# Patient Record
Sex: Female | Born: 1975 | Race: White | Hispanic: No | Marital: Married | Smoking: Former smoker
Health system: Southern US, Community
[De-identification: ages and names within clinical notes are randomized; demographics above are authoritative.]

## PROBLEM LIST (undated history)

## (undated) DIAGNOSIS — I10 Essential (primary) hypertension: Secondary | ICD-10-CM

## (undated) DIAGNOSIS — D689 Coagulation defect, unspecified: Secondary | ICD-10-CM

## (undated) DIAGNOSIS — F32A Depression, unspecified: Secondary | ICD-10-CM

## (undated) DIAGNOSIS — R112 Nausea with vomiting, unspecified: Secondary | ICD-10-CM

## (undated) HISTORY — PX: ABLATION: SHX5711

## (undated) HISTORY — DX: Essential (primary) hypertension: I10

## (undated) HISTORY — DX: Depression, unspecified: F32.A

## (undated) HISTORY — PX: RIGHT OOPHORECTOMY: SHX2359

## (undated) HISTORY — DX: Coagulation defect, unspecified: D68.9

---

## 2019-06-07 DIAGNOSIS — I471 Supraventricular tachycardia: Secondary | ICD-10-CM | POA: Insufficient documentation

## 2019-06-07 DIAGNOSIS — D219 Benign neoplasm of connective and other soft tissue, unspecified: Secondary | ICD-10-CM | POA: Insufficient documentation

## 2021-03-17 ENCOUNTER — Ambulatory Visit: Payer: Self-pay | Admitting: Medical-Surgical

## 2021-03-23 ENCOUNTER — Other Ambulatory Visit: Payer: Self-pay

## 2021-03-23 ENCOUNTER — Other Ambulatory Visit (HOSPITAL_BASED_OUTPATIENT_CLINIC_OR_DEPARTMENT_OTHER): Payer: Self-pay

## 2021-03-23 ENCOUNTER — Ambulatory Visit (INDEPENDENT_AMBULATORY_CARE_PROVIDER_SITE_OTHER): Payer: 59 | Admitting: Medical-Surgical

## 2021-03-23 ENCOUNTER — Encounter: Payer: Self-pay | Admitting: Medical-Surgical

## 2021-03-23 VITALS — BP 143/81 | HR 100 | Temp 98.6°F | Ht 63.25 in | Wt 199.9 lb

## 2021-03-23 DIAGNOSIS — Z7689 Persons encountering health services in other specified circumstances: Secondary | ICD-10-CM

## 2021-03-23 DIAGNOSIS — N809 Endometriosis, unspecified: Secondary | ICD-10-CM | POA: Insufficient documentation

## 2021-03-23 DIAGNOSIS — Z6835 Body mass index (BMI) 35.0-35.9, adult: Secondary | ICD-10-CM

## 2021-03-23 DIAGNOSIS — Z114 Encounter for screening for human immunodeficiency virus [HIV]: Secondary | ICD-10-CM | POA: Diagnosis not present

## 2021-03-23 DIAGNOSIS — R1032 Left lower quadrant pain: Secondary | ICD-10-CM | POA: Diagnosis not present

## 2021-03-23 DIAGNOSIS — R928 Other abnormal and inconclusive findings on diagnostic imaging of breast: Secondary | ICD-10-CM | POA: Insufficient documentation

## 2021-03-23 DIAGNOSIS — Z1329 Encounter for screening for other suspected endocrine disorder: Secondary | ICD-10-CM

## 2021-03-23 DIAGNOSIS — I10 Essential (primary) hypertension: Secondary | ICD-10-CM | POA: Diagnosis not present

## 2021-03-23 DIAGNOSIS — Z1159 Encounter for screening for other viral diseases: Secondary | ICD-10-CM | POA: Diagnosis not present

## 2021-03-23 MED ORDER — LOSARTAN POTASSIUM 50 MG PO TABS
50.0000 mg | ORAL_TABLET | Freq: Every day | ORAL | 1 refills | Status: DC
  Filled 2021-03-23: qty 90, 90d supply, fill #0
  Filled 2021-06-24: qty 90, 90d supply, fill #1

## 2021-03-23 NOTE — Progress Notes (Signed)
New Patient Office Visit  Subjective:  Patient ID: Barbara Hartman, female    DOB: 12-29-1975  Age: 46 y.o. MRN: [id number]  CC:  Chief Complaint  Patient presents with   Establish Care    HPI Barbara Hartman presents to establish care.  She recently relocated from [place], [place] due to her husband's job.  Hypertension-she has been taking losartan 50 mg daily since 2018.  Reports this keeps her blood pressure well controlled with home readings of 90-120s/70s.   Weight gain-does treadmill sessions for approximately 3 miles several times weekly as well as step aerobic classes into the use.  She does some light weight lifting but only a couple of times per week at this point.  She is interested in options for helping with weight loss.  Abdominal pain-approximately 2 weeks ago had a sharp pain in the left lower quadrant area.  She was unable to stand up straight and noted it lasted for hours.  She ended up waiting it out and it finally got some better after taking ibuprofen.  Since then it has been intermittent with a sharp pain that is followed by dull ache.  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  Past Medical History:  Diagnosis Date   Hypertension     Past Surgical History:  Procedure Laterality Date   ABLATION     RIGHT OOPHORECTOMY      Family History  Problem Relation Age of Onset   Hypertension Mother    Hypertension Father    Stroke Father    Bladder Cancer Father    Hypertension Brother    Hypertension Brother    Diabetes Maternal Grandmother     Social History   Socioeconomic History   Marital status: Married    Spouse name: Not on file   Number of children: Not on file   Years of education: Not on file   Highest education level: Not on file  Occupational History   Occupation: [employer]  Tobacco Use   Smoking status: Former    Types:  Cigarettes    Quit date: 2014    Years since quitting: 8.6   Smokeless tobacco: Never  Vaping Use   Vaping Use: Never used  Substance and Sexual Activity   Alcohol use: Yes    Comment: Occasionally   Drug use: Never   Sexual activity: Yes    Partners: Male    Birth control/protection: Surgical    Comment: Ablation  Other Topics Concern   Not on file  Social History Narrative   Not on file   Social Determinants of Health   Financial Resource Strain: Not on file  Food Insecurity: Not on file  Transportation Needs: Not on file  Physical Activity: Not on file  Stress: Not on file  Social Connections: Not on file  Intimate Partner Violence: Not on file    ROS Review of Systems  Constitutional:  Positive for unexpected weight change. Negative for chills, fatigue and fever.  HENT:  Negative for congestion, rhinorrhea, sinus pressure and sore throat.   Respiratory:  Negative for cough, chest tightness and shortness of breath.   Cardiovascular:  Negative for chest pain, palpitations and leg swelling.  Gastrointestinal:  Negative for abdominal pain (LLQ), constipation, diarrhea, nausea and vomiting.  Endocrine: Negative for cold intolerance and heat intolerance.  Genitourinary:  Negative for dysuria, frequency, urgency,  vaginal bleeding and vaginal discharge.  Skin:  Negative for rash and wound.  Neurological:  Negative for dizziness, light-headedness and headaches.  Hematological:  Does not bruise/bleed easily.  Psychiatric/Behavioral:  Negative for dysphoric mood, self-injury, sleep disturbance and suicidal ideas. The patient is not nervous/anxious.    Objective:   Today's Vitals: BP (!) 143/81   Pulse 100   Temp 98.6 F (37 C)   Ht 5' 3.25" (1.607 m)   Wt 199 lb 14.4 oz (90.7 kg)   SpO2 99%   BMI 35.13 kg/m   Physical Exam Vitals reviewed.  Constitutional:      General: She is not in acute distress.    Appearance: Normal appearance. She is obese. She is not  ill-appearing.  HENT:     Head: Normocephalic and atraumatic.  Cardiovascular:     Rate and Rhythm: Normal rate and regular rhythm.     Pulses: Normal pulses.     Heart sounds: Normal heart sounds. No murmur heard.   No friction rub. No gallop.  Pulmonary:     Effort: Pulmonary effort is normal. No respiratory distress.     Breath sounds: Normal breath sounds. No wheezing.  Skin:    General: Skin is warm and dry.  Neurological:     Mental Status: She is alert and oriented to person, place, and time.  Psychiatric:        Mood and Affect: Mood normal.        Behavior: Behavior normal.        Thought Content: Thought content normal.        Judgment: Judgment normal.    Assessment & Plan:   1. Encounter to establish care Reviewed available information and discussed care concerns with patient.   2. Essential hypertension Checking CBC with differential, CMP, and lipid panel today.  Continue losartan 50 mg daily.  Home readings at goal but patient reports anxiety with new patient appointment today. - CBC with Differential/Platelet - COMPLETE METABOLIC PANEL WITH GFR - Lipid panel  3.  LLQ abdominal pain She does have a history of endometriosis.  Consider possible endometriosis versus ovarian cyst versus constipation.  4. Class 2 severe obesity due to excess calories with serious comorbidity in adult, BMI 35.0-35.9 ([place]) Recommend regular exercise increasing strength training and decreasing cardiovascular exercising.  Recommend dietary modifications with a focus on high-protein, moderate carb, and low fat.  If interested in medications in the future, advised patient to reach out and we can discuss her options.  5. Screening for endocrine disorder Checking TSH today. - TSH  6. Need for hepatitis C screening test 7. Screening for HIV (human immunodeficiency virus) Discussed screening recommendations.  Patient is agreeable so adding to blood work today. - Hepatitis C antibody -  HIV Antibody (routine testing w rflx)  Outpatient Encounter Medications as of 03/23/2021  Medication Sig   Selenium Sulfide 2.25 % SHAM Lather on to back and shoulders daily, leave on for 3-5 minutes, then rinse off.   [DISCONTINUED] losartan (COZAAR) 50 MG tablet Take 50 mg by mouth daily.   losartan (COZAAR) 50 MG tablet Take 1 tablet (50 mg total) by mouth daily.   No facility-administered encounter medications on file as of 03/23/2021.    Follow-up: Return in about 6 months (around 09/22/2021) for HTN follow up.   Clearnce Sorrel, DNP, APRN, FNP-BC [facility]

## 2021-03-24 LAB — CBC WITH DIFFERENTIAL/PLATELET
Absolute Monocytes: 635 cells/uL (ref 200–950)
Basophils Absolute: 73 cells/uL (ref 0–200)
Basophils Relative: 1 %
Eosinophils Absolute: 88 cells/uL (ref 15–500)
Eosinophils Relative: 1.2 %
HCT: 46.4 % — ABNORMAL HIGH (ref 35.0–45.0)
Hemoglobin: 15.3 g/dL (ref 11.7–15.5)
Lymphs Abs: 2110 cells/uL (ref 850–3900)
MCH: 30.4 pg (ref 27.0–33.0)
MCHC: 33 g/dL (ref 32.0–36.0)
MCV: 92.1 fL (ref 80.0–100.0)
MPV: 9.9 fL (ref 7.5–12.5)
Monocytes Relative: 8.7 %
Neutro Abs: 4395 cells/uL (ref 1500–7800)
Neutrophils Relative %: 60.2 %
Platelets: 318 10*3/uL (ref 140–400)
RBC: 5.04 10*6/uL (ref 3.80–5.10)
RDW: 12.9 % (ref 11.0–15.0)
Total Lymphocyte: 28.9 %
WBC: 7.3 10*3/uL (ref 3.8–10.8)

## 2021-03-24 LAB — COMPLETE METABOLIC PANEL WITH GFR
AG Ratio: 1.5 (calc) (ref 1.0–2.5)
ALT: 11 U/L (ref 6–29)
AST: 12 U/L (ref 10–30)
Albumin: 4.6 g/dL (ref 3.6–5.1)
Alkaline phosphatase (APISO): 63 U/L (ref 31–125)
BUN: 19 mg/dL (ref 7–25)
CO2: 25 mmol/L (ref 20–32)
Calcium: 9.6 mg/dL (ref 8.6–10.2)
Chloride: 104 mmol/L (ref 98–110)
Creat: 0.86 mg/dL (ref 0.50–0.99)
Globulin: 3 g/dL (calc) (ref 1.9–3.7)
Glucose, Bld: 82 mg/dL (ref 65–99)
Potassium: 4.3 mmol/L (ref 3.5–5.3)
Sodium: 138 mmol/L (ref 135–146)
Total Bilirubin: 0.5 mg/dL (ref 0.2–1.2)
Total Protein: 7.6 g/dL (ref 6.1–8.1)
eGFR: 86 mL/min/{1.73_m2} (ref >=60)

## 2021-03-24 LAB — LIPID PANEL
Cholesterol: 191 mg/dL (ref <200)
HDL: 69 mg/dL (ref >=50)
LDL Cholesterol (Calc): 106 mg/dL (calc) — ABNORMAL HIGH
Non-HDL Cholesterol (Calc): 122 mg/dL (calc) (ref <130)
Total CHOL/HDL Ratio: 2.8 (calc) (ref <5.0)
Triglycerides: 69 mg/dL (ref <150)

## 2021-03-24 LAB — TSH: TSH: 3.22 mIU/L

## 2021-03-24 LAB — HIV ANTIBODY (ROUTINE TESTING W REFLEX): HIV 1&2 Ab, 4th Generation: NONREACTIVE

## 2021-03-24 LAB — HEPATITIS C ANTIBODY
Hepatitis C Ab: NONREACTIVE
SIGNAL TO CUT-OFF: 0.02 (ref <1.00)

## 2021-03-25 ENCOUNTER — Telehealth: Payer: Self-pay | Admitting: *Deleted

## 2021-03-25 NOTE — Telephone Encounter (Signed)
Left patient an urgent message to call the office so that pervious OB/GYN information can be obtained prior to scheduled appointment on 04/27/2021. Appointment information also provided.

## 2021-04-27 ENCOUNTER — Other Ambulatory Visit (HOSPITAL_COMMUNITY)
Admission: RE | Admit: 2021-04-27 | Discharge: 2021-04-27 | Disposition: A | Discharge status: Home / Self Care | Payer: 59 | Source: Ambulatory Visit | Attending: Obstetrics and Gynecology | Admitting: Obstetrics and Gynecology

## 2021-04-27 ENCOUNTER — Encounter: Payer: Self-pay | Admitting: Obstetrics and Gynecology

## 2021-04-27 ENCOUNTER — Other Ambulatory Visit: Payer: Self-pay

## 2021-04-27 ENCOUNTER — Ambulatory Visit (INDEPENDENT_AMBULATORY_CARE_PROVIDER_SITE_OTHER): Payer: 59 | Admitting: Obstetrics and Gynecology

## 2021-04-27 VITALS — BP 136/92 | HR 93 | Ht 63.0 in | Wt 195.0 lb

## 2021-04-27 DIAGNOSIS — N809 Endometriosis, unspecified: Secondary | ICD-10-CM | POA: Diagnosis not present

## 2021-04-27 DIAGNOSIS — Z01419 Encounter for gynecological examination (general) (routine) without abnormal findings: Secondary | ICD-10-CM

## 2021-04-27 DIAGNOSIS — R102 Pelvic and perineal pain: Secondary | ICD-10-CM

## 2021-04-27 NOTE — Progress Notes (Signed)
Pt was seen by PCP for left side pelvic pain. Pt states pain has went away but wanted to follow up here.

## 2021-04-27 NOTE — Progress Notes (Signed)
GYNECOLOGY ANNUAL PREVENTATIVE CARE ENCOUNTER NOTE  Subjective:   Barbara Hartman is a 45 y.o. G0P0000 female here for a annual gynecologic exam. Current complaints: had left sided pelvic pain for about a week, has improved now but concerned. 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 H/o ablation (husband has had vasectomy). Pain was intermittent, left mid abdomen, waxed and waned and finally improved.   Doesn't have periods but does get monthly cramping/pain that she attributes to endometriosis diagnosed on laparoscopic surgery 10 years ago. This pain has worsened with time but is manageable with ibuprofen, heating pads.   Denies abnormal vaginal bleeding, discharge, problems with intercourse or other gynecologic concerns. Declines STI screen.   Gynecologic History No LMP recorded. Patient has had an ablation. Contraception: vasectomy Last Pap: last year. Results: normal per patient Last mammogram: last year. Results: normal per patient DEXA: has never had  Obstetric History OB History  Gravida Para Term Preterm AB Living  0 0 0 0 0 0  SAB IAB Ectopic Multiple Live Births  0 0 0 0 0    Past Medical History:  Diagnosis Date   Hypertension     Past Surgical History:  Procedure Laterality Date   ABLATION     RIGHT OOPHORECTOMY      Current Outpatient Medications on File Prior to Visit  Medication Sig Dispense Refill   losartan (COZAAR) 50 MG tablet Take 1 tablet (50 mg total) by mouth daily. 90 tablet 1   Selenium Sulfide 2.25 % SHAM Lather on to back and shoulders daily, leave on for 3-5 minutes, then rinse off.     No current facility-administered medications on file prior to visit.    No Known Allergies  Social History   Socioeconomic History   Marital status: Married    Spouse name: Not on file   Number of children: Not on file   Years of education: Not on file   Highest education level: Not on file  Occupational History    Occupation: [employer] Tech  Tobacco Use   Smoking status: Former    Types: Cigarettes    Quit date: 2014    Years since quitting: 8.7   Smokeless tobacco: Never  Vaping Use   Vaping Use: Never used  Substance and Sexual Activity   Alcohol use: Yes    Comment: Occasionally   Drug use: Never   Sexual activity: Yes    Partners: Male    Birth control/protection: Surgical    Comment: Ablation  Other Topics Concern   Not on file  Social History Narrative   Not on file   Social Determinants of Health   Financial Resource Strain: Not on file  Food Insecurity: Not on file  Transportation Needs: Not on file  Physical Activity: Not on file  Stress: Not on file  Social Connections: Not on file  Intimate Partner Violence: Not on file    Family History  Problem Relation Age of Onset   Hypertension Mother    Hypertension Father    Stroke Father    Bladder Cancer Father    Hypertension Brother    Hypertension Brother    Diabetes Maternal Grandmother    The following portions of the patient's history were reviewed and updated as appropriate: allergies, current medications, past family history, past medical history, past social history, past surgical history and problem list.  Review of Systems Pertinent items are noted in HPI.   Objective:  BP (!) 136/92  Pulse 93   Ht 5\' 3"  (1.6 m)   Wt 195 lb (88.5 kg)   BMI 34.54 kg/m  CONSTITUTIONAL: Well-developed, well-nourished female in no acute distress.  HENT:  Normocephalic, atraumatic, External right and left ear normal. Oropharynx is clear and moist EYES: Conjunctivae and EOM are normal. Pupils are equal, round, and reactive to light. No scleral icterus.  NECK: Normal range of motion, supple, no masses.  Normal thyroid.  SKIN: Skin is warm and dry. No rash noted. Not diaphoretic. No erythema. No pallor. NEUROLOGIC: Alert and oriented to person, place, and time. Normal reflexes, muscle tone coordination. No cranial nerve deficit  noted. PSYCHIATRIC: Normal mood and affect. Normal behavior. Normal judgment and thought content. CARDIOVASCULAR: Normal heart rate noted RESPIRATORY: Effort normal, no problems with respiration noted. BREASTS: deferred ABDOMEN: Soft, no distention noted.  No tenderness, rebound or guarding.  PELVIC: Normal appearing external genitalia; normal appearing vaginal mucosa and cervix.  No abnormal discharge noted.  Pap smear obtained. Normal uterine size, no other palpable masses, no uterine or adnexal tenderness. MUSCULOSKELETAL: Normal range of motion. No tenderness.  No cyanosis, clubbing, or edema.  2+ distal pulses.  Exam done with chaperone present.   Assessment and Plan:   1. Well woman exam No acute issues - Cytology - PAP( [place]) - MM Digital Screening; Future  2. Pelvic pain Suspect endometriosis vs left ovarian cysts vs GI - favor ovarian cyst as not related to BM and improved over the course of a week - h/o dermoid on right with right oophorectomy - reviewed that if cystic, pain may be transient and self resolving or require further management - US PELVIC COMPLETE WITH TRANSVAGINAL; Future - further management based on results of ultrasound  3. Endometriosis Reviewed options for management, hormonal if she has worsening pain Reviewed will likely improve with menopause She declines hormonal management at this point but will call if pain worsens   Will follow up results of pap smear and manage accordingly. Encouraged improvement in diet and exercise.  COVID vaccine  Declines STI screen. Mammogram ordered Referral for colonoscopy n/a Flu vaccine  DEXA not due based on age  Routine preventative health maintenance measures emphasized. Please refer to After Visit Summary for other counseling recommendations.    Feliz Beam, MD, [place] for [facility])

## 2021-04-28 LAB — CYTOLOGY - PAP
Comment: NEGATIVE
Diagnosis: NEGATIVE
High risk HPV: NEGATIVE

## 2021-04-30 ENCOUNTER — Ambulatory Visit (INDEPENDENT_AMBULATORY_CARE_PROVIDER_SITE_OTHER): Payer: 59

## 2021-04-30 ENCOUNTER — Other Ambulatory Visit: Payer: Self-pay

## 2021-04-30 DIAGNOSIS — D259 Leiomyoma of uterus, unspecified: Secondary | ICD-10-CM | POA: Diagnosis not present

## 2021-04-30 DIAGNOSIS — N809 Endometriosis, unspecified: Secondary | ICD-10-CM

## 2021-04-30 DIAGNOSIS — N888 Other specified noninflammatory disorders of cervix uteri: Secondary | ICD-10-CM | POA: Diagnosis not present

## 2021-04-30 DIAGNOSIS — Q5001 Congenital absence of ovary, unilateral: Secondary | ICD-10-CM | POA: Diagnosis not present

## 2021-04-30 DIAGNOSIS — R102 Pelvic and perineal pain: Secondary | ICD-10-CM

## 2021-04-30 DIAGNOSIS — Z9889 Other specified postprocedural states: Secondary | ICD-10-CM

## 2021-04-30 DIAGNOSIS — Z01419 Encounter for gynecological examination (general) (routine) without abnormal findings: Secondary | ICD-10-CM

## 2021-04-30 DIAGNOSIS — N83201 Unspecified ovarian cyst, right side: Secondary | ICD-10-CM | POA: Diagnosis not present

## 2021-04-30 DIAGNOSIS — N83202 Unspecified ovarian cyst, left side: Secondary | ICD-10-CM | POA: Diagnosis not present

## 2021-04-30 DIAGNOSIS — Z1231 Encounter for screening mammogram for malignant neoplasm of breast: Secondary | ICD-10-CM | POA: Diagnosis not present

## 2021-05-01 ENCOUNTER — Other Ambulatory Visit: Payer: Self-pay | Admitting: *Deleted

## 2021-05-01 DIAGNOSIS — N83209 Unspecified ovarian cyst, unspecified side: Secondary | ICD-10-CM

## 2021-05-01 NOTE — Progress Notes (Signed)
Orders placed for Pelvic Ultrasound f/u in 3 months.  Imaging to call pt with appt.

## 2021-06-25 ENCOUNTER — Other Ambulatory Visit (HOSPITAL_BASED_OUTPATIENT_CLINIC_OR_DEPARTMENT_OTHER): Payer: Self-pay

## 2021-07-28 ENCOUNTER — Other Ambulatory Visit: Payer: Self-pay

## 2021-07-28 ENCOUNTER — Ambulatory Visit (INDEPENDENT_AMBULATORY_CARE_PROVIDER_SITE_OTHER): Payer: 59

## 2021-07-28 DIAGNOSIS — N83202 Unspecified ovarian cyst, left side: Secondary | ICD-10-CM

## 2021-07-28 DIAGNOSIS — D259 Leiomyoma of uterus, unspecified: Secondary | ICD-10-CM | POA: Diagnosis not present

## 2021-07-28 DIAGNOSIS — Z90722 Acquired absence of ovaries, bilateral: Secondary | ICD-10-CM | POA: Diagnosis not present

## 2021-07-28 DIAGNOSIS — N83209 Unspecified ovarian cyst, unspecified side: Secondary | ICD-10-CM

## 2021-09-21 NOTE — Progress Notes (Addendum)
°  HPI with pertinent ROS:   CC: HTN follow up  HPI: Pleasant 46 y.o. female presenting for HTN follow up. Doing well on Losartan. Has not missed any doses, takes daily at night.Denies CP, SOB, palpitations, lower leg edema, or headaches. Checks blood pressure at home occasionally and is within normal range. This morning blood pressure at home was 110/78. States her pulse is normally around 80.  Last time in office had complaints of abdominal pain. Had it evaluated and everything was fine. Started having low back pain in December and was going to come be seen, but woke up one morning and passed a kidney stone. Had back pain again last week, but has not had any pain since. No urinary complaints.   Still discouraged about weight. She has been weight training 3x/week and doing cardio 2x/week. She is weighing her food, limiting her calories, eating low fat foods, vegetables and chicken.Trying to get her protein in.    I reviewed the past medical history, family history, social history, surgical history, and allergies today and no changes were needed.  Please see the problem list section below in epic for further details.   Physical exam:   General: Well Developed, well nourished, and in no acute distress.  Neuro: Alert and oriented x3. HEENT: Normocephalic, atraumatic.  Skin: Warm and dry. Cardiac: Regular rate and rhythm, no murmurs rubs or gallops, no lower extremity edema.  Respiratory: Clear to auscultation bilaterally. Not using accessory muscles, speaking in full sentences.  Impression and Recommendations:    1. Essential hypertension Doing well on Losartan. Continue at current dosage. Monitor BP at home and notify if consistently >130/90. Continue exercise routine and limit salt intake.   2. History of kidney stones Passed stone on her own in December. No concerns at this time. Monitor symptoms and notify if concern.   3. Class 1 obesity due to excess calories with serious  comorbidity and body mass index (BMI) of 34.0 to 34.9 in adult Doing well with exercise routine and diet. Continue current regimen, but encouraged to increase protein intake.  Discussed weight loss medication options and contraindications with her history. Discussed metformin vs. Wellbutrin and patient would like to trial Wellbutrin. Starting wellbutrin 150mg .  Return for Return in 4-6 weeks for medication follow up . ___________________________________________ Jeanann Lewandowsky Student NP

## 2021-09-22 ENCOUNTER — Encounter: Payer: Self-pay | Admitting: Medical-Surgical

## 2021-09-22 ENCOUNTER — Other Ambulatory Visit (HOSPITAL_BASED_OUTPATIENT_CLINIC_OR_DEPARTMENT_OTHER): Payer: Self-pay

## 2021-09-22 ENCOUNTER — Other Ambulatory Visit: Payer: Self-pay

## 2021-09-22 ENCOUNTER — Ambulatory Visit (INDEPENDENT_AMBULATORY_CARE_PROVIDER_SITE_OTHER): Payer: 59 | Admitting: Medical-Surgical

## 2021-09-22 VITALS — BP 125/84 | HR 95 | Wt 197.0 lb

## 2021-09-22 DIAGNOSIS — E6609 Other obesity due to excess calories: Secondary | ICD-10-CM

## 2021-09-22 DIAGNOSIS — Z6834 Body mass index (BMI) 34.0-34.9, adult: Secondary | ICD-10-CM

## 2021-09-22 DIAGNOSIS — I10 Essential (primary) hypertension: Secondary | ICD-10-CM | POA: Diagnosis not present

## 2021-09-22 DIAGNOSIS — Z87442 Personal history of urinary calculi: Secondary | ICD-10-CM | POA: Diagnosis not present

## 2021-09-22 MED ORDER — BUPROPION HCL ER (XL) 150 MG PO TB24
150.0000 mg | ORAL_TABLET | ORAL | 0 refills | Status: DC
  Filled 2021-09-22: qty 90, 90d supply, fill #0

## 2021-09-22 MED ORDER — LOSARTAN POTASSIUM 50 MG PO TABS
50.0000 mg | ORAL_TABLET | Freq: Every day | ORAL | 1 refills | Status: DC
  Filled 2021-09-22: qty 90, 90d supply, fill #0
  Filled 2021-12-28: qty 90, 90d supply, fill #1

## 2021-09-22 NOTE — Progress Notes (Signed)
Medical screening examination/treatment was performed by qualified nurse practitioner student and as supervising provider I was immediately available for consultation/collaboration. I have reviewed documentation and agree with assessment and plan. ° °Sabeen Piechocki L. Kaliyah Gladman, DNP, APRN, FNP-BC °[facility] °Primary Care and Sports Medicine ° °

## 2021-11-03 ENCOUNTER — Ambulatory Visit (INDEPENDENT_AMBULATORY_CARE_PROVIDER_SITE_OTHER): Payer: 59 | Admitting: Medical-Surgical

## 2021-11-03 ENCOUNTER — Encounter: Payer: Self-pay | Admitting: Medical-Surgical

## 2021-11-03 VITALS — BP 129/94 | HR 84 | Resp 20 | Ht 63.0 in | Wt 193.5 lb

## 2021-11-03 DIAGNOSIS — Z7689 Persons encountering health services in other specified circumstances: Secondary | ICD-10-CM

## 2021-11-03 DIAGNOSIS — Z6834 Body mass index (BMI) 34.0-34.9, adult: Secondary | ICD-10-CM

## 2021-11-03 DIAGNOSIS — E6609 Other obesity due to excess calories: Secondary | ICD-10-CM

## 2021-11-03 NOTE — Progress Notes (Signed)
?  HPI with pertinent ROS:  ? ?CC: weight management follow up ? ?HPI: ?Pleasant 46 year old female presenting today for follow-up since starting Wellbutrin about 6 weeks ago.  She has been taking Wellbutrin XL 150 mg daily to help with appetite suppression and weight loss. Doing well overall without side effects. Has been working on protein shakes in the morning, salads at lunch, chicken/veggies at dinner. Weighing foods and keep track on an app. Averaging about 1600 calories per day. Trying more veggies. Exercising 5 days per week: 2 days cardio, 3 days weight training. Clothes are fitting better.  ? ?I reviewed the past medical history, family history, social history, surgical history, and allergies today and no changes were needed.  Please see the problem list section below in epic for further details. ? ? ?Physical exam:  ? ?General: Well Developed, well nourished, and in no acute distress.  ?Neuro: Alert and oriented x3.  ?HEENT: Normocephalic, atraumatic.  ?Skin: Warm and dry. ?Cardiac: Regular rate and rhythm.  ?Respiratory: Not using accessory muscles, speaking in full sentences. ? ?Impression and Recommendations:   ? ?1. Encounter for weight management ?2. Class 1 obesity due to excess calories with serious comorbidity and body mass index (BMI) of 34.0 to 34.9 in adult ?Has lost about 4 and half to 5 pounds since starting Wellbutrin.  Since she is tolerating the medication well and feels like it is helping some, we can certainly continue at the 150 mg dose.  Advised patient that if she would like to try higher dose, she is welcome to take 2 of the 150 mg tablets once daily over the next 2 weeks.  MyChart message will be sent in 2 weeks evaluating her tolerance and response to the 300 mg daily dose.  If she is doing well on 300 mg daily, I will be glad to send in a refill for her.  Plan to follow-up at the 47-monthpoint to see how she is doing with her weight loss efforts. ? ?Return in about 3 months (around  02/02/2022) for weight check. ?___________________________________________ ?JClearnce Sorrel DNP, APRN, FNP-BC ?[facility] ?[place]?

## 2021-11-18 ENCOUNTER — Other Ambulatory Visit (HOSPITAL_BASED_OUTPATIENT_CLINIC_OR_DEPARTMENT_OTHER): Payer: Self-pay

## 2021-11-18 MED ORDER — BUPROPION HCL ER (XL) 300 MG PO TB24
300.0000 mg | ORAL_TABLET | ORAL | 1 refills | Status: DC
  Filled 2021-11-18: qty 90, 90d supply, fill #0

## 2021-12-29 ENCOUNTER — Other Ambulatory Visit (HOSPITAL_BASED_OUTPATIENT_CLINIC_OR_DEPARTMENT_OTHER): Payer: Self-pay

## 2022-02-01 NOTE — Progress Notes (Unsigned)
   Established Patient Office Visit  Subjective   Patient ID: Barbara Hartman, female   DOB: Jun 26, 1976 Age: 46 y.o. MRN: [id number]   No chief complaint on file.   HPI Pleasant 46 year old female presenting today for follow-up on weight management.  ROS    Objective:    There were no vitals filed for this visit.   Physical Exam    No results found for this or any previous visit (from the past 24 hour(s)).   {Labs (Optional):23779}  The 10-year ASCVD risk score (Arnett DK, et al., 2019) is: 0.7%   Values used to calculate the score:     Age: 70 years     Sex: Female     Is Non-Hispanic African American: No     Diabetic: No     Tobacco smoker: No     Systolic Blood Pressure: 092 mmHg     Is BP treated: Yes     HDL Cholesterol: 69 mg/dL     Total Cholesterol: 191 mg/dL   Assessment & Plan:   No problem-specific Assessment & Plan notes found for this encounter.   No follow-ups on file.  ___________________________________________ Clearnce Sorrel, DNP, APRN, FNP-BC [facility] [place]

## 2022-02-02 ENCOUNTER — Ambulatory Visit (INDEPENDENT_AMBULATORY_CARE_PROVIDER_SITE_OTHER): Payer: 59 | Admitting: Medical-Surgical

## 2022-02-02 ENCOUNTER — Encounter: Payer: Self-pay | Admitting: Medical-Surgical

## 2022-02-02 ENCOUNTER — Other Ambulatory Visit (HOSPITAL_BASED_OUTPATIENT_CLINIC_OR_DEPARTMENT_OTHER): Payer: Self-pay

## 2022-02-02 VITALS — BP 137/93 | HR 83 | Ht 63.0 in | Wt 176.0 lb

## 2022-02-02 DIAGNOSIS — Z7689 Persons encountering health services in other specified circumstances: Secondary | ICD-10-CM

## 2022-02-02 DIAGNOSIS — I10 Essential (primary) hypertension: Secondary | ICD-10-CM

## 2022-02-02 MED ORDER — BUPROPION HCL ER (XL) 300 MG PO TB24
300.0000 mg | ORAL_TABLET | ORAL | 1 refills | Status: DC
  Filled 2022-02-02 – 2022-02-23 (×2): qty 90, 90d supply, fill #0
  Filled 2022-05-18: qty 90, 90d supply, fill #1

## 2022-02-02 MED ORDER — LOSARTAN POTASSIUM 50 MG PO TABS
50.0000 mg | ORAL_TABLET | Freq: Every day | ORAL | 1 refills | Status: DC
  Filled 2022-02-02 – 2022-03-31 (×2): qty 90, 90d supply, fill #0
  Filled 2022-07-08: qty 90, 90d supply, fill #1

## 2022-02-23 ENCOUNTER — Other Ambulatory Visit (HOSPITAL_BASED_OUTPATIENT_CLINIC_OR_DEPARTMENT_OTHER): Payer: Self-pay

## 2022-03-05 ENCOUNTER — Ambulatory Visit (INDEPENDENT_AMBULATORY_CARE_PROVIDER_SITE_OTHER): Payer: 59 | Admitting: Medical-Surgical

## 2022-03-05 ENCOUNTER — Encounter: Payer: Self-pay | Admitting: Medical-Surgical

## 2022-03-05 VITALS — BP 121/81 | HR 88 | Resp 20 | Ht 63.0 in | Wt 171.7 lb

## 2022-03-05 DIAGNOSIS — Q828 Other specified congenital malformations of skin: Secondary | ICD-10-CM | POA: Diagnosis not present

## 2022-03-05 NOTE — Progress Notes (Signed)
   Established Patient Office Visit  Subjective   Patient ID: Barbara Hartman, female   DOB: [birth year] Age: 46 y.o. MRN: [id number]   Chief Complaint  Patient presents with   skin tag removal   HPI Pleasant 46 year old female presenting today for skin tag removal.  Has had multiple skin tags on her chest between her breasts that have been very annoying and tend to get caught on her clothing.  She would like these removed today.   Objective:    Vitals:   03/05/22 1525  BP: 121/81  Pulse: 88  Resp: 20  Height: '5\' 3"'$  (1.6 m)  Weight: 171 lb 11.2 oz (77.9 kg)  SpO2: 99%  BMI (Calculated): 30.42   Physical Exam Vitals and nursing note reviewed.  Constitutional:      General: She is not in acute distress.    Appearance: Normal appearance. She is not ill-appearing.  HENT:     Head: Normocephalic and atraumatic.  Cardiovascular:     Rate and Rhythm: Normal rate and regular rhythm.     Pulses: Normal pulses.     Heart sounds: Normal heart sounds.  Pulmonary:     Effort: Pulmonary effort is normal. No respiratory distress.     Breath sounds: Normal breath sounds. No wheezing, rhonchi or rales.  Skin:    General: Skin is warm and dry.     Comments: Multiple accessory skin tags over the sternum and upper breasts.  Skin tags flesh-colored as well as dark brown.  No evidence of irritation, erythema, or swelling.  Neurological:     Mental Status: She is alert and oriented to person, place, and time.  Psychiatric:        Mood and Affect: Mood normal.        Behavior: Behavior normal.        Thought Content: Thought content normal.        Judgment: Judgment normal.    No results found for this or any previous visit (from the past 24 hour(s)).     The 10-year ASCVD risk score (Arnett DK, et al., 2019) is: 0.6%   Values used to calculate the score:     Age: 21 years     Sex: Female     Is Non-Hispanic African American: No     Diabetic: No     Tobacco smoker: No     Systolic  Blood Pressure: 121 mmHg     Is BP treated: Yes     HDL Cholesterol: 69 mg/dL     Total Cholesterol: 191 mg/dL   Assessment & Plan:   1. Accessory skin tags Procedure: Skin tag removal Informed consent:  Discussed risks (permanent scarring, infection, pain, bleeding, bruising, redness, and recurrence of the lesion) and benefits of the procedure, as well as the alternatives.  She is aware that skin tags are benign lesions, and their removal is often not considered medically necessary.  Informed consent was obtained. Anesthesia: Topical cold spray The area was prepared and draped in a standard fashion. Snip removal was performed.  Hemostasis achieved with silver nitrate. Patient declined antibiotic ointment and sterile dressing.   The patient tolerated procedure well. The patient was instructed on post-op care.   Number of lesions removed: 13  Return if symptoms worsen or fail to improve.  ___________________________________________ Clearnce Sorrel, DNP, APRN, FNP-BC [facility] [place]

## 2022-04-01 ENCOUNTER — Other Ambulatory Visit (HOSPITAL_BASED_OUTPATIENT_CLINIC_OR_DEPARTMENT_OTHER): Payer: Self-pay

## 2022-05-03 ENCOUNTER — Other Ambulatory Visit: Payer: Self-pay | Admitting: Medical-Surgical

## 2022-05-03 DIAGNOSIS — Z1231 Encounter for screening mammogram for malignant neoplasm of breast: Secondary | ICD-10-CM

## 2022-05-12 ENCOUNTER — Ambulatory Visit (INDEPENDENT_AMBULATORY_CARE_PROVIDER_SITE_OTHER): Payer: 59

## 2022-05-12 DIAGNOSIS — Z1231 Encounter for screening mammogram for malignant neoplasm of breast: Secondary | ICD-10-CM

## 2022-05-18 ENCOUNTER — Other Ambulatory Visit (HOSPITAL_BASED_OUTPATIENT_CLINIC_OR_DEPARTMENT_OTHER): Payer: Self-pay

## 2022-08-19 ENCOUNTER — Other Ambulatory Visit: Payer: Self-pay | Admitting: Medical-Surgical

## 2022-08-20 ENCOUNTER — Other Ambulatory Visit (HOSPITAL_BASED_OUTPATIENT_CLINIC_OR_DEPARTMENT_OTHER): Payer: Self-pay

## 2022-08-20 MED ORDER — BUPROPION HCL ER (XL) 300 MG PO TB24
300.0000 mg | ORAL_TABLET | ORAL | 0 refills | Status: DC
  Filled 2022-08-20: qty 90, 90d supply, fill #0

## 2022-08-26 ENCOUNTER — Encounter: Payer: Self-pay | Admitting: Medical-Surgical

## 2022-08-26 ENCOUNTER — Ambulatory Visit (INDEPENDENT_AMBULATORY_CARE_PROVIDER_SITE_OTHER): Payer: Commercial Managed Care - PPO | Admitting: Medical-Surgical

## 2022-08-26 ENCOUNTER — Other Ambulatory Visit (HOSPITAL_BASED_OUTPATIENT_CLINIC_OR_DEPARTMENT_OTHER): Payer: Self-pay

## 2022-08-26 VITALS — BP 126/86 | HR 81 | Resp 20 | Ht 63.0 in | Wt 163.1 lb

## 2022-08-26 DIAGNOSIS — E559 Vitamin D deficiency, unspecified: Secondary | ICD-10-CM

## 2022-08-26 DIAGNOSIS — Z0000 Encounter for general adult medical examination without abnormal findings: Secondary | ICD-10-CM | POA: Diagnosis not present

## 2022-08-26 DIAGNOSIS — I10 Essential (primary) hypertension: Secondary | ICD-10-CM | POA: Diagnosis not present

## 2022-08-26 DIAGNOSIS — Z1211 Encounter for screening for malignant neoplasm of colon: Secondary | ICD-10-CM | POA: Diagnosis not present

## 2022-08-26 MED ORDER — LOSARTAN POTASSIUM 50 MG PO TABS
50.0000 mg | ORAL_TABLET | Freq: Every day | ORAL | 3 refills | Status: DC
  Filled 2022-08-26: qty 90, 90d supply, fill #0
  Filled 2023-01-11: qty 90, 90d supply, fill #1

## 2022-08-26 MED ORDER — BUPROPION HCL ER (XL) 300 MG PO TB24
300.0000 mg | ORAL_TABLET | ORAL | 3 refills | Status: DC
  Filled 2022-08-26 – 2022-11-23 (×2): qty 90, 90d supply, fill #0
  Filled 2023-02-22: qty 90, 90d supply, fill #1
  Filled 2023-06-08: qty 90, 90d supply, fill #2

## 2022-08-26 NOTE — Progress Notes (Signed)
Complete physical exam  Patient: Barbara Hartman   DOB: 10/28/1975   47 y.o. Female  MRN: [id number]  Subjective:    Chief Complaint  Patient presents with   Annual Exam    Barbara Hartman is a 47 y.o. female who presents today for a complete physical exam. She reports consuming a general diet. Home exercise routine includes treadmill and weight training. She generally feels well. She reports sleeping well. She does not have additional problems to discuss today.    Most recent fall risk assessment:    08/26/2022    1:14 PM  [place] in the past year? 0  Number falls in past yr: 0  Injury with Fall? 0  Risk for fall due to : No Fall Risks  Follow up Falls evaluation completed     Most recent depression screenings:    08/26/2022    1:14 PM 03/05/2022    3:26 PM  PHQ 2/9 Scores  PHQ - 2 Score 0 0    Vision:Not within last year  and Dental: No current dental problems and No regular dental care     Patient Care Team: Samuel Bouche, NP as PCP - General (Nurse Practitioner)   Outpatient Medications Prior to Visit  Medication Sig   [DISCONTINUED] buPROPion (WELLBUTRIN XL) 300 MG 24 hr tablet Take 1 tablet (300 mg total) by mouth every morning.   [DISCONTINUED] losartan (COZAAR) 50 MG tablet Take 1 tablet (50 mg total) by mouth daily.   No facility-administered medications prior to visit.   Review of Systems  Constitutional:  Negative for chills, fever, malaise/fatigue and weight loss.  HENT:  Negative for congestion, ear pain, hearing loss, sinus pain and sore throat.   Eyes:  Negative for blurred vision, photophobia and pain.  Respiratory:  Negative for cough, shortness of breath and wheezing.   Cardiovascular:  Negative for chest pain, palpitations and leg swelling.  Gastrointestinal:  Negative for abdominal pain, constipation, diarrhea, heartburn, nausea and vomiting.  Genitourinary:  Negative for dysuria, frequency and urgency.  Musculoskeletal:  Negative for  falls and neck pain.  Skin:  Negative for itching and rash.  Neurological:  Negative for dizziness, weakness and headaches.  Endo/Heme/Allergies:  Negative for polydipsia. Does not bruise/bleed easily.  Psychiatric/Behavioral:  Negative for depression, substance abuse and suicidal ideas. The patient is not nervous/anxious.      Objective:    BP 126/86 (BP Location: Left Arm, Cuff Size: Normal)   Pulse 81   Resp 20   Ht '5\' 3"'$  (1.6 m)   Wt 163 lb 1.6 oz (74 kg)   SpO2 100%   BMI 28.89 kg/m    Physical Exam Constitutional:      General: She is not in acute distress.    Appearance: Normal appearance. She is not ill-appearing.  HENT:     Head: Normocephalic and atraumatic.     Right Ear: Tympanic membrane, ear canal and external ear normal. There is no impacted cerumen.     Left Ear: Tympanic membrane, ear canal and external ear normal. There is no impacted cerumen.     Nose: Nose normal. No congestion or rhinorrhea.     Mouth/Throat:     Mouth: Mucous membranes are moist.     Pharynx: No oropharyngeal exudate or posterior oropharyngeal erythema.  Eyes:     General: No scleral icterus.       Right eye: No discharge.        Left eye: No discharge.  Extraocular Movements: Extraocular movements intact.     Conjunctiva/sclera: Conjunctivae normal.     Pupils: Pupils are equal, round, and reactive to light.  Neck:     Thyroid: No thyromegaly.     Vascular: No carotid bruit or JVD.     Trachea: Trachea normal.  Cardiovascular:     Rate and Rhythm: Normal rate and regular rhythm.     Pulses: Normal pulses.     Heart sounds: Normal heart sounds. No murmur heard.    No friction rub. No gallop.  Pulmonary:     Effort: Pulmonary effort is normal. No respiratory distress.     Breath sounds: Normal breath sounds. No wheezing.  Abdominal:     General: Bowel sounds are normal. There is no distension.     Palpations: Abdomen is soft.     Tenderness: There is no abdominal tenderness.  There is no guarding.  Musculoskeletal:        General: Normal range of motion.     Cervical back: Normal range of motion and neck supple.  Lymphadenopathy:     Cervical: No cervical adenopathy.  Skin:    General: Skin is warm and dry.  Neurological:     Mental Status: She is alert and oriented to person, place, and time.     Cranial Nerves: No cranial nerve deficit.  Psychiatric:        Mood and Affect: Mood normal.        Behavior: Behavior normal.        Thought Content: Thought content normal.        Judgment: Judgment normal.      No results found for any visits on 08/26/22.     Assessment & Plan:    Routine Health Maintenance and Physical Exam  Immunization History  Administered Date(s) Administered   Hepb-cpg 10/02/2018   Influenza-Unspecified 10/02/2018, 04/17/2019, 05/20/2020, 04/17/2021, 05/09/2022   MMR 10/02/2018, 11/02/2018   PFIZER(Purple Top)SARS-COV-2 Vaccination 07/20/2019, 08/10/2019, 04/30/2020   PPD Test 10/02/2018, 11/02/2018   Tdap 01/29/2020    Health Maintenance  Topic Date Due   COLONOSCOPY (Pts 45-3yr Insurance coverage will need to be confirmed)  Never done   COVID-19 Vaccine (4 - 2023-24 season) 09/11/2022 (Originally 03/26/2022)   PAP SMEAR-Modifier  04/27/2024   DTaP/Tdap/Td (2 - Td or Tdap) 01/28/2030   INFLUENZA VACCINE  Completed   Hepatitis C Screening  Completed   HIV Screening  Completed   HPV VACCINES  Aged Out    Discussed health benefits of physical activity, and encouraged her to engage in regular exercise appropriate for her age and condition.  1. Essential hypertension Blood pressure at goal.  Continue losartan 50 mg daily.  Checking labs as below.  Recommend low-sodium diet and regular intentional exercise.  Continue weight loss efforts.  Monitor blood pressure at home with a goal of 130/80 or less. - Lipid panel - COMPLETE METABOLIC PANEL WITH GFR - CBC with Differential/Platelet  2. Vitamin D deficiency Checking  vitamin D. - VITAMIN D 25 Hydroxy (Vit-D Deficiency, Fractures)  3. Annual physical exam Checking labs.  Needs updating vision and dental care.  Once she finds new providers, she will get this done.  Wellness information provided with AVS.  4. Colon cancer screening Referring to GI for colonoscopy. - Ambulatory referral to Gastroenterology  Return in about 1 year (around 08/27/2023) for annual physical exam or sooner if needed.   JSamuel Bouche NP

## 2022-08-27 LAB — CBC WITH DIFFERENTIAL/PLATELET
Absolute Monocytes: 559 cells/uL (ref 200–950)
Basophils Absolute: 48 cells/uL (ref 0–200)
Basophils Relative: 0.7 %
Eosinophils Absolute: 110 cells/uL (ref 15–500)
Eosinophils Relative: 1.6 %
HCT: 42.9 % (ref 35.0–45.0)
Hemoglobin: 14.5 g/dL (ref 11.7–15.5)
Lymphs Abs: 2836 cells/uL (ref 850–3900)
MCH: 31.3 pg (ref 27.0–33.0)
MCHC: 33.8 g/dL (ref 32.0–36.0)
MCV: 92.5 fL (ref 80.0–100.0)
MPV: 9.7 fL (ref 7.5–12.5)
Monocytes Relative: 8.1 %
Neutro Abs: 3347 cells/uL (ref 1500–7800)
Neutrophils Relative %: 48.5 %
Platelets: 285 10*3/uL (ref 140–400)
RBC: 4.64 10*6/uL (ref 3.80–5.10)
RDW: 12.6 % (ref 11.0–15.0)
Total Lymphocyte: 41.1 %
WBC: 6.9 10*3/uL (ref 3.8–10.8)

## 2022-08-27 LAB — LIPID PANEL
Cholesterol: 162 mg/dL (ref <200)
HDL: 62 mg/dL (ref >=50)
LDL Cholesterol (Calc): 84 mg/dL (calc)
Non-HDL Cholesterol (Calc): 100 mg/dL (calc) (ref <130)
Total CHOL/HDL Ratio: 2.6 (calc) (ref <5.0)
Triglycerides: 69 mg/dL (ref <150)

## 2022-08-27 LAB — COMPLETE METABOLIC PANEL WITH GFR
AG Ratio: 1.7 (calc) (ref 1.0–2.5)
ALT: 14 U/L (ref 6–29)
AST: 11 U/L (ref 10–35)
Albumin: 4.5 g/dL (ref 3.6–5.1)
Alkaline phosphatase (APISO): 56 U/L (ref 31–125)
BUN: 18 mg/dL (ref 7–25)
CO2: 25 mmol/L (ref 20–32)
Calcium: 9.1 mg/dL (ref 8.6–10.2)
Chloride: 106 mmol/L (ref 98–110)
Creat: 0.86 mg/dL (ref 0.50–0.99)
Globulin: 2.7 g/dL (calc) (ref 1.9–3.7)
Glucose, Bld: 82 mg/dL (ref 65–99)
Potassium: 4.1 mmol/L (ref 3.5–5.3)
Sodium: 140 mmol/L (ref 135–146)
Total Bilirubin: 0.6 mg/dL (ref 0.2–1.2)
Total Protein: 7.2 g/dL (ref 6.1–8.1)
eGFR: 85 mL/min/{1.73_m2} (ref >=60)

## 2022-08-27 LAB — VITAMIN D 25 HYDROXY (VIT D DEFICIENCY, FRACTURES): Vit D, 25-Hydroxy: 68 ng/mL (ref 30–100)

## 2022-11-24 ENCOUNTER — Other Ambulatory Visit (HOSPITAL_BASED_OUTPATIENT_CLINIC_OR_DEPARTMENT_OTHER): Payer: Self-pay

## 2022-12-24 ENCOUNTER — Other Ambulatory Visit (HOSPITAL_BASED_OUTPATIENT_CLINIC_OR_DEPARTMENT_OTHER): Payer: Self-pay

## 2023-01-14 ENCOUNTER — Encounter (INDEPENDENT_AMBULATORY_CARE_PROVIDER_SITE_OTHER): Payer: Commercial Managed Care - PPO | Admitting: Medical-Surgical

## 2023-01-14 DIAGNOSIS — I10 Essential (primary) hypertension: Secondary | ICD-10-CM | POA: Diagnosis not present

## 2023-01-14 NOTE — Telephone Encounter (Signed)
I spent 5 total minutes of online digital evaluation and management services in this patient-initiated request for online care. 

## 2023-01-21 NOTE — Telephone Encounter (Signed)
I have never seen this patient, routing to PCP

## 2023-07-08 ENCOUNTER — Other Ambulatory Visit: Payer: Self-pay | Admitting: Medical-Surgical

## 2023-07-08 DIAGNOSIS — Z1231 Encounter for screening mammogram for malignant neoplasm of breast: Secondary | ICD-10-CM

## 2023-07-14 DIAGNOSIS — Z1231 Encounter for screening mammogram for malignant neoplasm of breast: Secondary | ICD-10-CM

## 2023-09-06 IMAGING — MG MM DIGITAL SCREENING BILAT W/ TOMO AND CAD
8 series · 8 of 24 positions shown · non-contrast
Comparison: Previous exam(s).

CLINICAL DATA: Screening.

EXAM:
DIGITAL SCREENING BILATERAL MAMMOGRAM WITH TOMOSYNTHESIS AND CAD
TECHNIQUE: Bilateral screening digital craniocaudal and mediolateral oblique
mammograms were obtained. Bilateral screening digital breast
tomosynthesis was performed. The images were evaluated with
computer-aided detection.

[R MLO synth-2D]
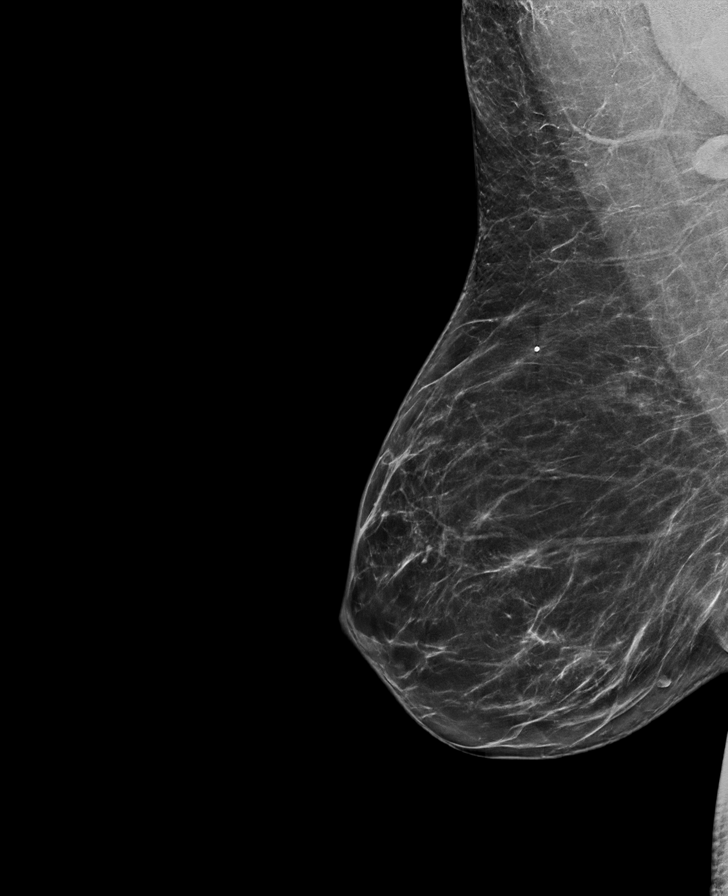

[R CC synth-2D]
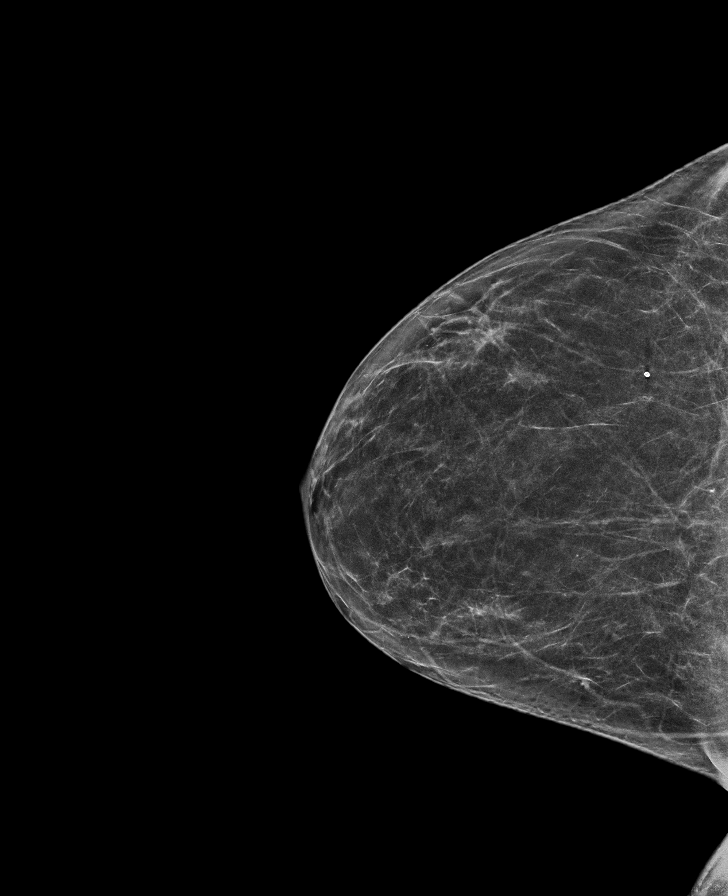

[L CC synth-2D]
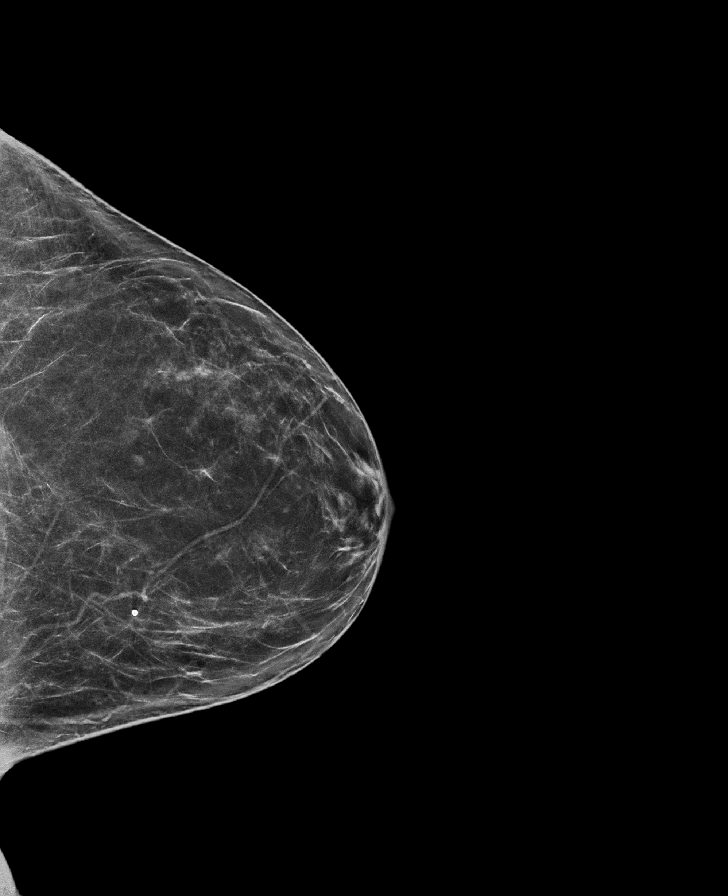

[L MLO synth-2D]
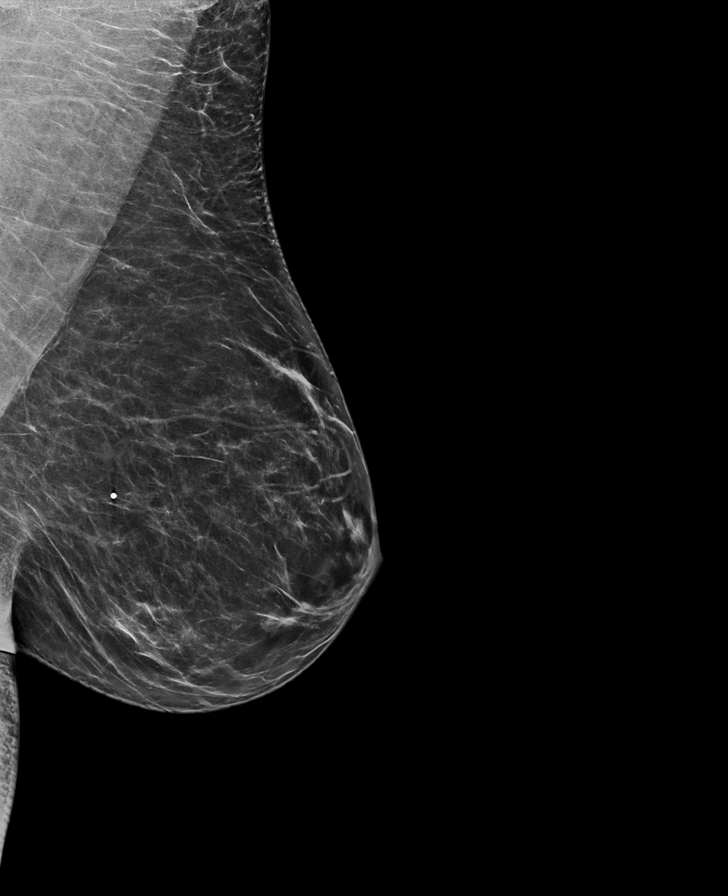

[L CC tomo · tomo slice 35/70.0]
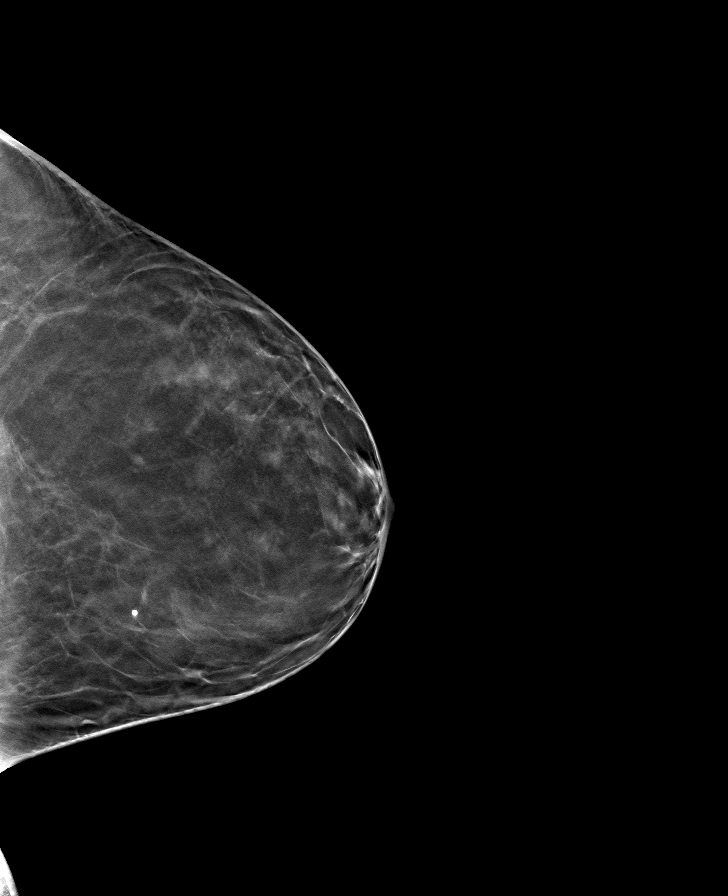

[R CC tomo · tomo slice 33/65.0]
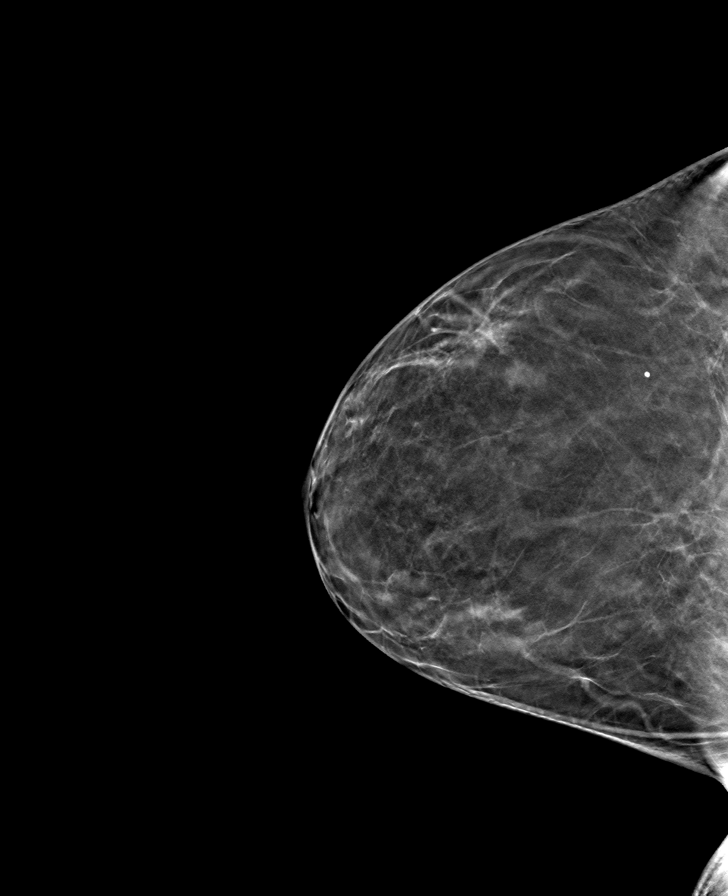

[R MLO tomo · tomo slice 38/75.0]
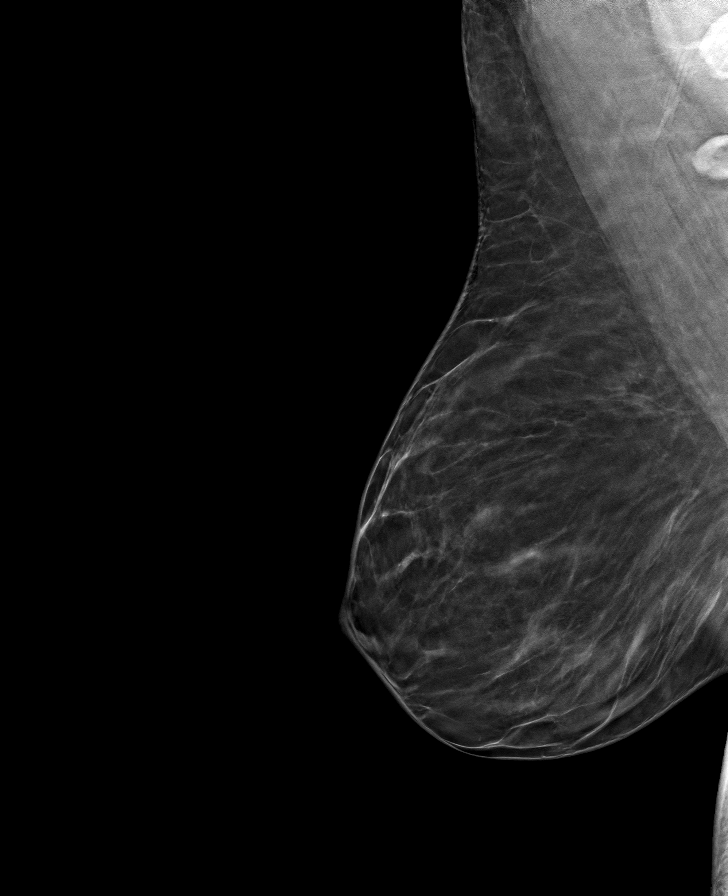

[L MLO tomo · tomo slice 37/72.0]
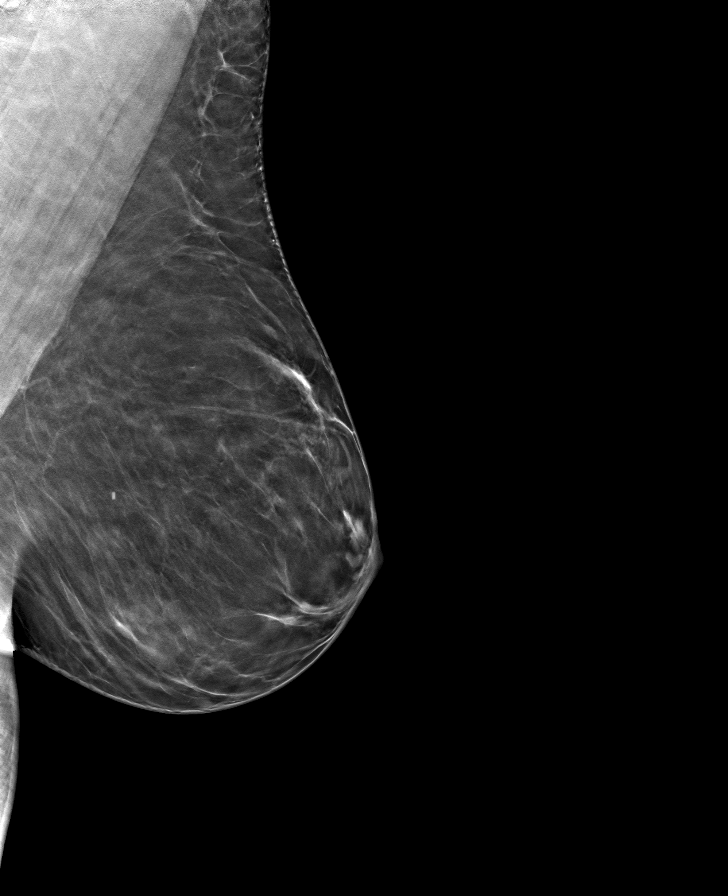

[8 of 24 positions shown; findings below may reference images not displayed]

ACR Breast Density Category b: There are scattered areas of
fibroglandular density.
FINDINGS: There are no findings suspicious for malignancy.
IMPRESSION: No mammographic evidence of malignancy. A result letter of this
screening mammogram will be mailed directly to the patient.

RECOMMENDATION:
Screening mammogram in one year. (Code:51-O-LD2)

BI-RADS CATEGORY  1: Negative.

## 2023-09-23 ENCOUNTER — Encounter: Payer: Commercial Managed Care - PPO | Admitting: Medical-Surgical

## 2023-09-25 ENCOUNTER — Other Ambulatory Visit: Payer: Self-pay | Admitting: Medical-Surgical

## 2023-09-27 ENCOUNTER — Other Ambulatory Visit (HOSPITAL_BASED_OUTPATIENT_CLINIC_OR_DEPARTMENT_OTHER): Payer: Self-pay

## 2023-09-27 MED ORDER — BUPROPION HCL ER (XL) 300 MG PO TB24
300.0000 mg | ORAL_TABLET | ORAL | 0 refills | Status: DC
  Filled 2023-09-27: qty 30, 30d supply, fill #0

## 2023-11-23 ENCOUNTER — Ambulatory Visit (INDEPENDENT_AMBULATORY_CARE_PROVIDER_SITE_OTHER): Admitting: Medical-Surgical

## 2023-11-23 ENCOUNTER — Other Ambulatory Visit (HOSPITAL_BASED_OUTPATIENT_CLINIC_OR_DEPARTMENT_OTHER): Payer: Self-pay

## 2023-11-23 ENCOUNTER — Encounter: Payer: Self-pay | Admitting: Medical-Surgical

## 2023-11-23 VITALS — BP 117/79 | HR 87 | Resp 20 | Ht 63.0 in | Wt 164.6 lb

## 2023-11-23 DIAGNOSIS — Z6835 Body mass index (BMI) 35.0-35.9, adult: Secondary | ICD-10-CM

## 2023-11-23 DIAGNOSIS — Z0000 Encounter for general adult medical examination without abnormal findings: Secondary | ICD-10-CM

## 2023-11-23 DIAGNOSIS — E66812 Obesity, class 2: Secondary | ICD-10-CM | POA: Diagnosis not present

## 2023-11-23 DIAGNOSIS — I10 Essential (primary) hypertension: Secondary | ICD-10-CM

## 2023-11-23 DIAGNOSIS — E7800 Pure hypercholesterolemia, unspecified: Secondary | ICD-10-CM | POA: Insufficient documentation

## 2023-11-23 DIAGNOSIS — Z1211 Encounter for screening for malignant neoplasm of colon: Secondary | ICD-10-CM

## 2023-11-23 MED ORDER — BUPROPION HCL ER (XL) 300 MG PO TB24
300.0000 mg | ORAL_TABLET | ORAL | 3 refills | Status: AC
  Filled 2023-11-23: qty 30, 30d supply, fill #0
  Filled 2023-12-28: qty 30, 30d supply, fill #1
  Filled 2024-01-23: qty 30, 30d supply, fill #2
  Filled 2024-02-24: qty 30, 30d supply, fill #3
  Filled 2024-03-29: qty 30, 30d supply, fill #4
  Filled 2024-04-30: qty 30, 30d supply, fill #5
  Filled 2024-06-01: qty 30, 30d supply, fill #6
  Filled 2024-06-28: qty 30, 30d supply, fill #7
  Filled 2024-07-30: qty 30, 30d supply, fill #8
  Filled 2024-08-26: qty 30, 30d supply, fill #9

## 2023-11-23 NOTE — Progress Notes (Signed)
 Complete physical exam  Patient: Barbara Hartman   DOB: 01-Dec-1975   48 y.o. Female  MRN: [id number]  Subjective:    Chief Complaint  Patient presents with   Annual Exam    Barbara Hartman is a 48 y.o. female who presents today for a complete physical exam. She reports consuming a general diet.  Daily exercise with weights and cardio.  She generally feels well. She reports sleeping fairly well. She does not have additional problems to discuss today.    Most recent fall risk assessment:    08/26/2022    1:14 PM  Fall Risk   Falls in the past year? 0  Number falls in past yr: 0  Injury with Fall? 0  Risk for fall due to : No Fall Risks  Follow up Falls evaluation completed     Most recent depression screenings:    11/23/2023    9:06 AM 08/26/2022    1:14 PM  PHQ 2/9 Scores  PHQ - 2 Score 0 0   Vision:Not within last year  and Dental: No current dental problems and Receives regular dental care    Patient Care Team: Cherre Cornish, NP as PCP - General (Nurse Practitioner)   Outpatient Medications Prior to Visit  Medication Sig   [DISCONTINUED] buPROPion  (WELLBUTRIN  XL) 300 MG 24 hr tablet Take 1 tablet (300 mg total) by mouth every morning. *NEED APPOINTMENT FOR FURTHER REFILLS.*   No facility-administered medications prior to visit.    Review of Systems  Constitutional:  Negative for chills, fever, malaise/fatigue and weight loss.  HENT:  Negative for congestion, ear pain, hearing loss, sinus pain and sore throat.   Eyes:  Negative for blurred vision, photophobia and pain.  Respiratory:  Negative for cough, shortness of breath and wheezing.   Cardiovascular:  Negative for chest pain, palpitations and leg swelling.  Gastrointestinal:  Negative for abdominal pain, constipation, diarrhea, heartburn, nausea and vomiting.  Genitourinary:  Negative for dysuria, frequency and urgency.  Musculoskeletal:  Negative for falls and neck pain.  Skin:  Negative for itching and rash.   Neurological:  Negative for dizziness, weakness and headaches.  Endo/Heme/Allergies:  Negative for polydipsia. Does not bruise/bleed easily.  Psychiatric/Behavioral:  Negative for depression, substance abuse and suicidal ideas. The patient has insomnia. The patient is not nervous/anxious.      Objective:    BP 117/79 (BP Location: Left Arm, Cuff Size: Normal)   Pulse 87   Resp 20   Ht 5\' 3"  (1.6 m)   Wt 164 lb 9.6 oz (74.7 kg)   SpO2 98%   BMI 29.16 kg/m    Physical Exam Vitals reviewed.  Constitutional:      General: She is not in acute distress.    Appearance: Normal appearance. She is not ill-appearing.  HENT:     Head: Normocephalic and atraumatic.     Right Ear: Tympanic membrane, ear canal and external ear normal. There is no impacted cerumen.     Left Ear: Tympanic membrane, ear canal and external ear normal. There is no impacted cerumen.     Nose: Nose normal. No congestion or rhinorrhea.     Mouth/Throat:     Mouth: Mucous membranes are moist.     Pharynx: No oropharyngeal exudate or posterior oropharyngeal erythema.  Eyes:     General: No scleral icterus.       Right eye: No discharge.        Left eye: No discharge.     Extraocular  Movements: Extraocular movements intact.     Conjunctiva/sclera: Conjunctivae normal.     Pupils: Pupils are equal, round, and reactive to light.  Neck:     Thyroid: No thyromegaly.     Vascular: No carotid bruit or JVD.     Trachea: Trachea normal.  Cardiovascular:     Rate and Rhythm: Normal rate and regular rhythm.     Pulses: Normal pulses.     Heart sounds: Normal heart sounds. No murmur heard.    No friction rub. No gallop.  Pulmonary:     Effort: Pulmonary effort is normal. No respiratory distress.     Breath sounds: Normal breath sounds. No wheezing.  Abdominal:     General: Bowel sounds are normal. There is no distension.     Palpations: Abdomen is soft.     Tenderness: There is no abdominal tenderness. There is no  guarding.  Musculoskeletal:        General: Normal range of motion.     Cervical back: Normal range of motion and neck supple.  Lymphadenopathy:     Cervical: No cervical adenopathy.  Skin:    General: Skin is warm and dry.  Neurological:     Mental Status: She is alert and oriented to person, place, and time.     Cranial Nerves: No cranial nerve deficit.  Psychiatric:        Mood and Affect: Mood normal.        Behavior: Behavior normal.        Thought Content: Thought content normal.        Judgment: Judgment normal.   No results found for any visits on 11/23/23.     Assessment & Plan:    Routine Health Maintenance and Physical Exam  Immunization History  Administered Date(s) Administered   Hepb-cpg 10/02/2018   Influenza-Unspecified 10/02/2018, 04/17/2019, 05/20/2020, 04/17/2021, 05/09/2022, 05/06/2023   MMR 10/02/2018, 11/02/2018   PFIZER(Purple Top)SARS-COV-2 Vaccination 07/20/2019, 08/10/2019, 04/30/2020   PPD Test 10/02/2018, 11/02/2018   Tdap 01/29/2020    Health Maintenance  Topic Date Due   Colonoscopy  Never done   COVID-19 Vaccine (4 - 2024-25 season) 03/27/2023   INFLUENZA VACCINE  02/24/2024   Cervical Cancer Screening (HPV/Pap Cotest)  04/27/2026   DTaP/Tdap/Td (2 - Td or Tdap) 01/28/2030   Hepatitis C Screening  Completed   HIV Screening  Completed   HPV VACCINES  Aged Out   Meningococcal B Vaccine  Aged Out    Discussed health benefits of physical activity, and encouraged her to engage in regular exercise appropriate for her age and condition.  1. Annual physical exam (Primary) Checking labs as below. UTD on preventative care. Wellness information provided with AVS. - CBC with Differential/Platelet - CMP14+EGFR - Lipid panel  2. Essential hypertension Home readings at goal. Checking labs. Recheck of BP at goal. Continue low Na diet and regular intentional exercise.  - CBC with Differential/Platelet - CMP14+EGFR - Lipid panel  3. Elevated  LDL cholesterol level Checking lipids.  - Lipid panel  4. Class 2 severe obesity due to excess calories with serious comorbidity and body mass index (BMI) of 35.0 to 35.9 in adult [facility]) Has successfully lost weight and now has a BMI of 29. Not at her personal goal and plans to continue her efforts. Has done well on Wellbutrin  300mg  daily so refilling today.   5. Colon cancer screening Referring to GI.  - Ambulatory referral to Gastroenterology   Return in about 1 year (around 11/22/2024) for  annual physical exam.     Hertha Gergen, NP

## 2023-11-23 NOTE — Patient Instructions (Signed)
 Preventive Care 16-47 Years Old, Female  Preventive care refers to lifestyle choices and visits with your health care provider that can promote health and wellness. Preventive care visits are also called wellness exams.  What can I expect for my preventive care visit?  Counseling  Your health care provider may ask you questions about your:  Medical history, including:  Past medical problems.  Family medical history.  Pregnancy history.  Current health, including:  Menstrual cycle.  Method of birth control.  Emotional well-being.  Home life and relationship well-being.  Sexual activity and sexual health.  Lifestyle, including:  Alcohol, nicotine or tobacco, and drug use.  Access to firearms.  Diet, exercise, and sleep habits.  Work and work Astronomer.  Sunscreen use.  Safety issues such as seatbelt and bike helmet use.  Physical exam  Your health care provider will check your:  Height and weight. These may be used to calculate your BMI (body mass index). BMI is a measurement that tells if you are at a healthy weight.  Waist circumference. This measures the distance around your waistline. This measurement also tells if you are at a healthy weight and may help predict your risk of certain diseases, such as type 2 diabetes and high blood pressure.  Heart rate and blood pressure.  Body temperature.  Skin for abnormal spots.  What immunizations do I need?    Vaccines are usually given at various ages, according to a schedule. Your health care provider will recommend vaccines for you based on your age, medical history, and lifestyle or other factors, such as travel or where you work.  What tests do I need?  Screening  Your health care provider may recommend screening tests for certain conditions. This may include:  Lipid and cholesterol levels.  Diabetes screening. This is done by checking your blood sugar (glucose) after you have not eaten for a while (fasting).  Pelvic exam and Pap test.  Hepatitis B test.  Hepatitis C  test.  HIV (human immunodeficiency virus) test.  STI (sexually transmitted infection) testing, if you are at risk.  Lung cancer screening.  Colorectal cancer screening.  Mammogram. Talk with your health care provider about when you should start having regular mammograms. This may depend on whether you have a family history of breast cancer.  BRCA-related cancer screening. This may be done if you have a family history of breast, ovarian, tubal, or peritoneal cancers.  Bone density scan. This is done to screen for osteoporosis.  Talk with your health care provider about your test results, treatment options, and if necessary, the need for more tests.  Follow these instructions at home:  Eating and drinking    Eat a diet that includes fresh fruits and vegetables, whole grains, lean protein, and low-fat dairy products.  Take vitamin and mineral supplements as recommended by your health care provider.  Do not drink alcohol if:  Your health care provider tells you not to drink.  You are pregnant, may be pregnant, or are planning to become pregnant.  If you drink alcohol:  Limit how much you have to 0-1 drink a day.  Know how much alcohol is in your drink. In the [place] one drink equals one 12 oz bottle of beer (355 mL), one 5 oz glass of wine (148 mL), or one 1 oz glass of hard liquor (44 mL).  Lifestyle  Brush your teeth every morning and night with fluoride toothpaste. Floss one time each day.  Exercise for at least  30 minutes 5 or more days each week.  Do not use any products that contain nicotine or tobacco. These products include cigarettes, chewing tobacco, and vaping devices, such as e-cigarettes. If you need help quitting, ask your health care provider.  Do not use drugs.  If you are sexually active, practice safe sex. Use a condom or other form of protection to prevent STIs.  If you do not wish to become pregnant, use a form of birth control. If you plan to become pregnant, see your health care provider for a  prepregnancy visit.  Take aspirin only as told by your health care provider. Make sure that you understand how much to take and what form to take. Work with your health care provider to find out whether it is safe and beneficial for you to take aspirin daily.  Find healthy ways to manage stress, such as:  Meditation, yoga, or listening to music.  Journaling.  Talking to a trusted person.  Spending time with friends and family.  Minimize exposure to UV radiation to reduce your risk of skin cancer.  Safety  Always wear your seat belt while driving or riding in a vehicle.  Do not drive:  If you have been drinking alcohol. Do not ride with someone who has been drinking.  When you are tired or distracted.  While texting.  If you have been using any mind-altering substances or drugs.  Wear a helmet and other protective equipment during sports activities.  If you have firearms in your house, make sure you follow all gun safety procedures.  Seek help if you have been physically or sexually abused.  What's next?  Visit your health care provider once a year for an annual wellness visit.  Ask your health care provider how often you should have your eyes and teeth checked.  Stay up to date on all vaccines.  This information is not intended to replace advice given to you by your health care provider. Make sure you discuss any questions you have with your health care provider.  Document Revised: 01/07/2021 Document Reviewed: 01/07/2021  Elsevier Patient Education  2024 [employer].

## 2023-11-24 ENCOUNTER — Encounter: Payer: Self-pay | Admitting: Medical-Surgical

## 2023-11-24 LAB — CMP14+EGFR
ALT: 10 IU/L (ref 0–32)
AST: 12 IU/L (ref 0–40)
Albumin: 4.5 g/dL (ref 3.9–4.9)
Alkaline Phosphatase: 57 IU/L (ref 44–121)
BUN/Creatinine Ratio: 31 — ABNORMAL HIGH (ref 9–23)
BUN: 27 mg/dL — ABNORMAL HIGH (ref 6–24)
Bilirubin Total: 0.4 mg/dL (ref 0.0–1.2)
CO2: 18 mmol/L — ABNORMAL LOW (ref 20–29)
Calcium: 9.1 mg/dL (ref 8.7–10.2)
Chloride: 103 mmol/L (ref 96–106)
Creatinine, Ser: 0.88 mg/dL (ref 0.57–1.00)
Globulin, Total: 2.6 g/dL (ref 1.5–4.5)
Glucose: 84 mg/dL (ref 70–99)
Potassium: 4.6 mmol/L (ref 3.5–5.2)
Sodium: 138 mmol/L (ref 134–144)
Total Protein: 7.1 g/dL (ref 6.0–8.5)
eGFR: 82 mL/min/{1.73_m2} (ref >59)

## 2023-11-24 LAB — LIPID PANEL
Chol/HDL Ratio: 2.9 ratio (ref 0.0–4.4)
Cholesterol, Total: 181 mg/dL (ref 100–199)
HDL: 62 mg/dL (ref >39)
LDL Chol Calc (NIH): 108 mg/dL — ABNORMAL HIGH (ref 0–99)
Triglycerides: 58 mg/dL (ref 0–149)
VLDL Cholesterol Cal: 11 mg/dL (ref 5–40)

## 2023-11-24 LAB — CBC WITH DIFFERENTIAL/PLATELET
Basophils Absolute: 0.1 10*3/uL (ref 0.0–0.2)
Basos: 1 %
EOS (ABSOLUTE): 0.1 10*3/uL (ref 0.0–0.4)
Eos: 1 %
Hematocrit: 44.5 % (ref 34.0–46.6)
Hemoglobin: 15 g/dL (ref 11.1–15.9)
Immature Grans (Abs): 0 10*3/uL (ref 0.0–0.1)
Immature Granulocytes: 0 %
Lymphocytes Absolute: 1.7 10*3/uL (ref 0.7–3.1)
Lymphs: 31 %
MCH: 31.2 pg (ref 26.6–33.0)
MCHC: 33.7 g/dL (ref 31.5–35.7)
MCV: 93 fL (ref 79–97)
Monocytes Absolute: 0.6 10*3/uL (ref 0.1–0.9)
Monocytes: 11 %
Neutrophils Absolute: 3.1 10*3/uL (ref 1.4–7.0)
Neutrophils: 56 %
Platelets: 277 10*3/uL (ref 150–450)
RBC: 4.81 x10E6/uL (ref 3.77–5.28)
RDW: 12.5 % (ref 11.7–15.4)
WBC: 5.5 10*3/uL (ref 3.4–10.8)

## 2023-12-04 IMAGING — US US PELVIS COMPLETE WITH TRANSVAGINAL
1 series · 13 of 25 positions shown · non-contrast
Comparison: 04/30/2021

CLINICAL DATA: Ovarian cysts, follow-up; history of endometrial
ablation, RIGHT oophorectomy



[Series 1: us pelvic complete with transvaginal · 13 of 115 slices shown]
[im 1/115]
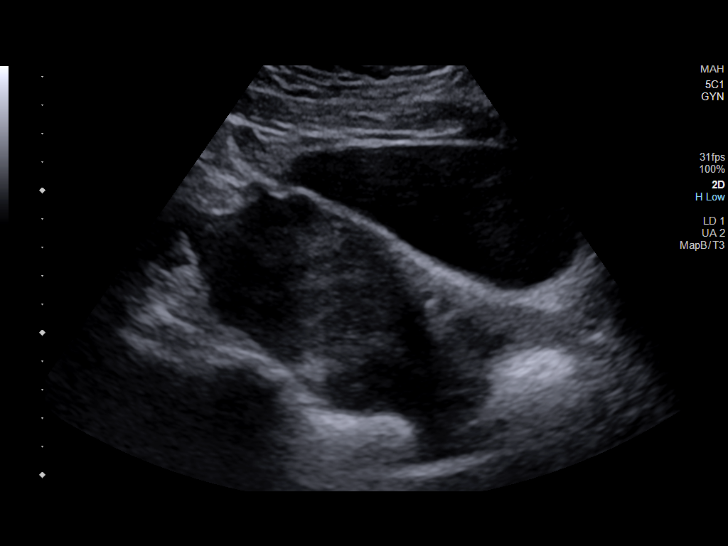
[im 10/115]
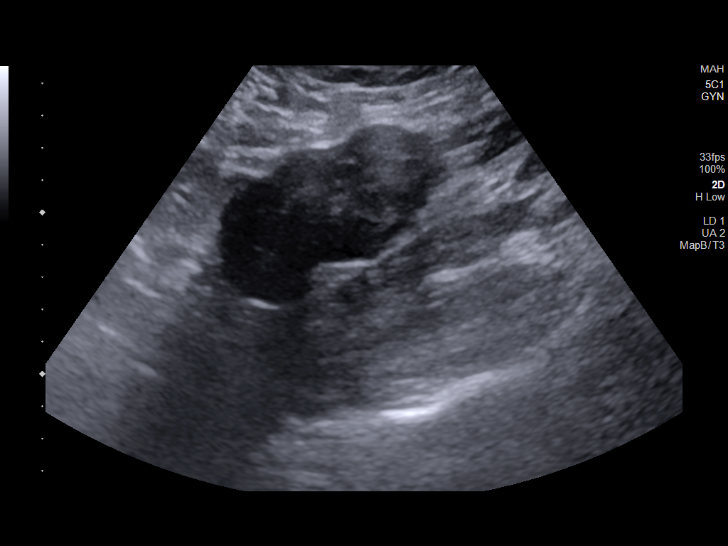
[im 20/115]
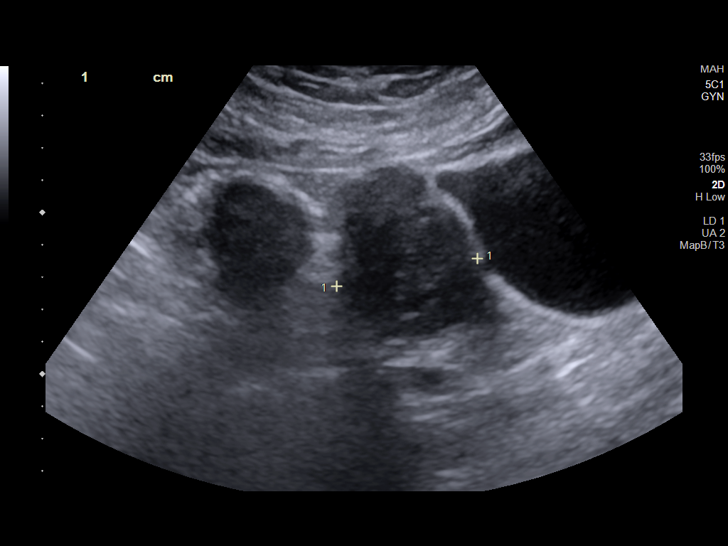
[im 29/115]
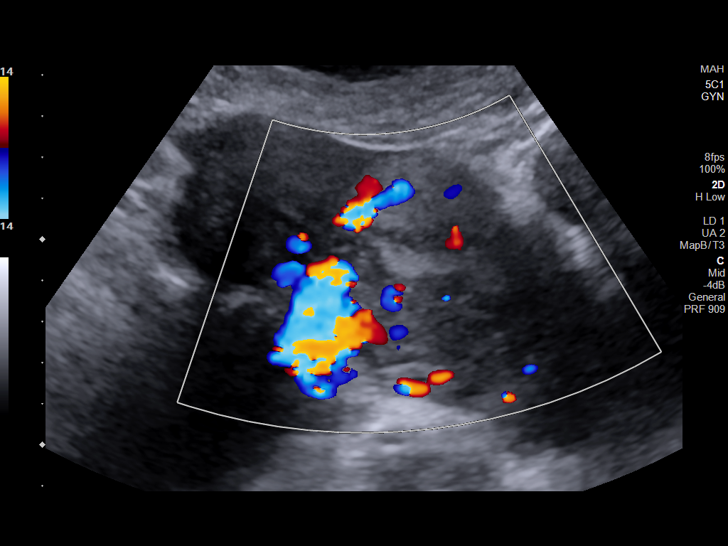
[im 39/115]
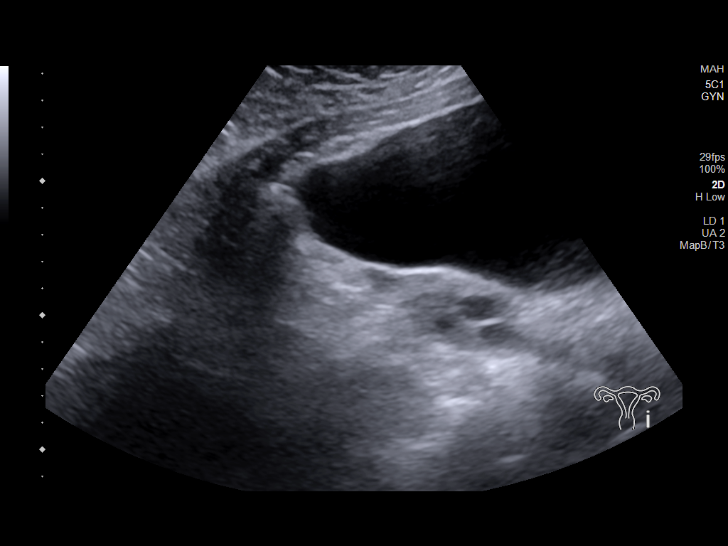
[im 48/115]
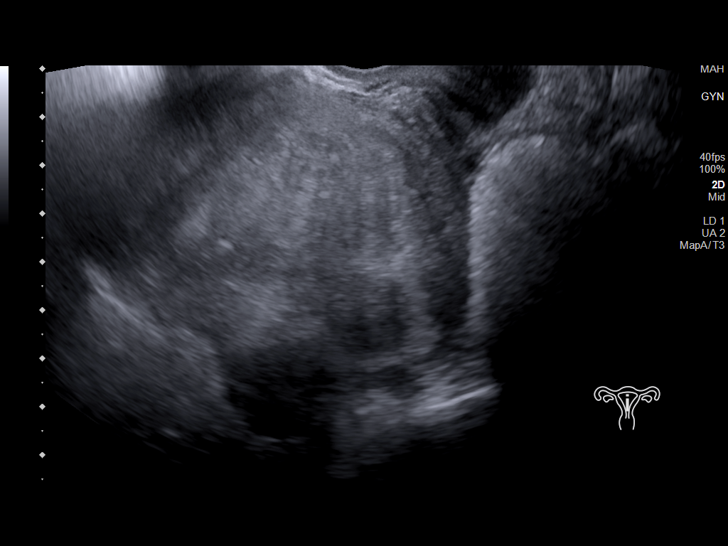
[im 58/115]
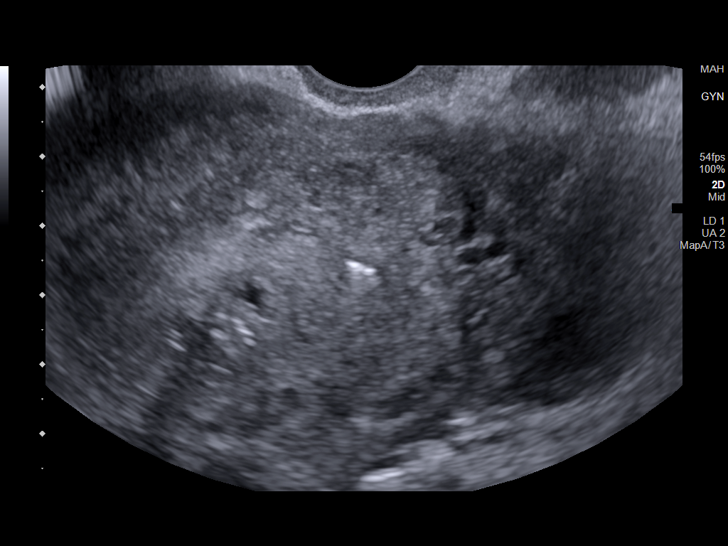
[im 67/115]
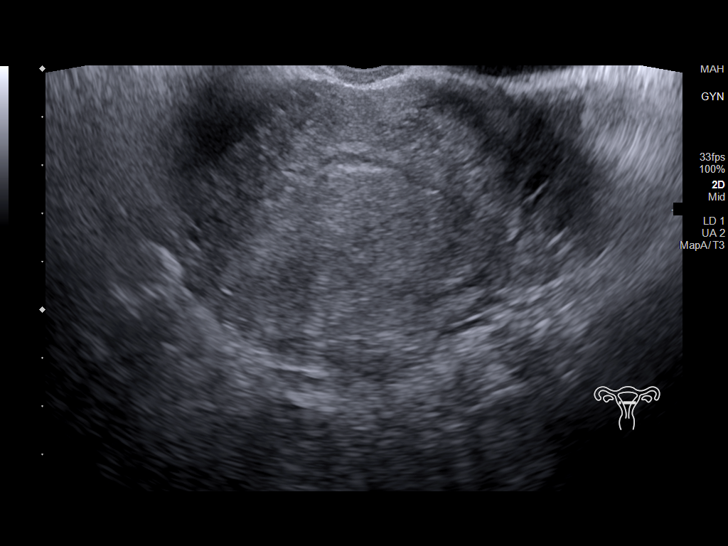
[im 77/115]
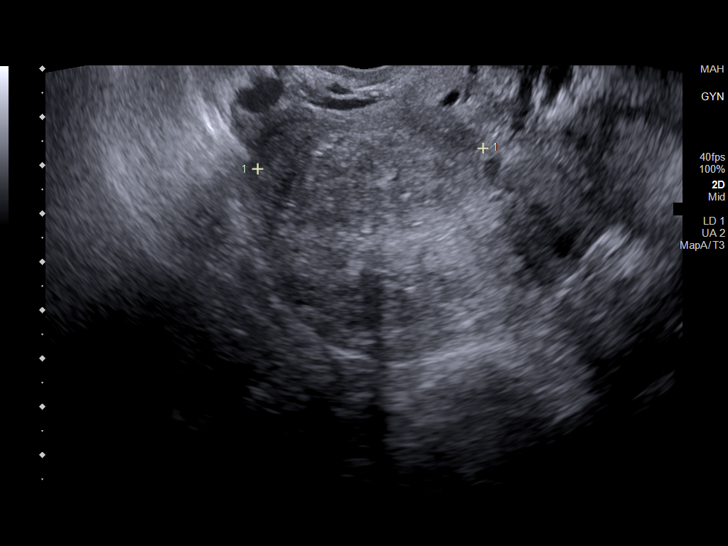
[im 86/115]
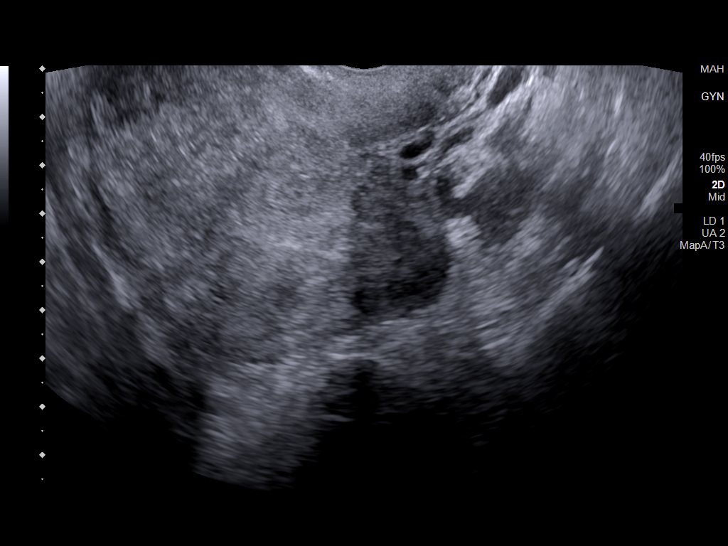
[im 96/115]
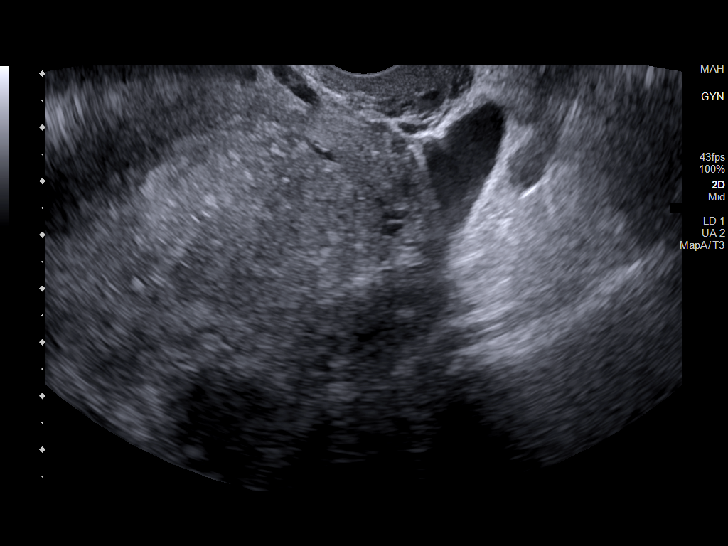
[im 105/115]
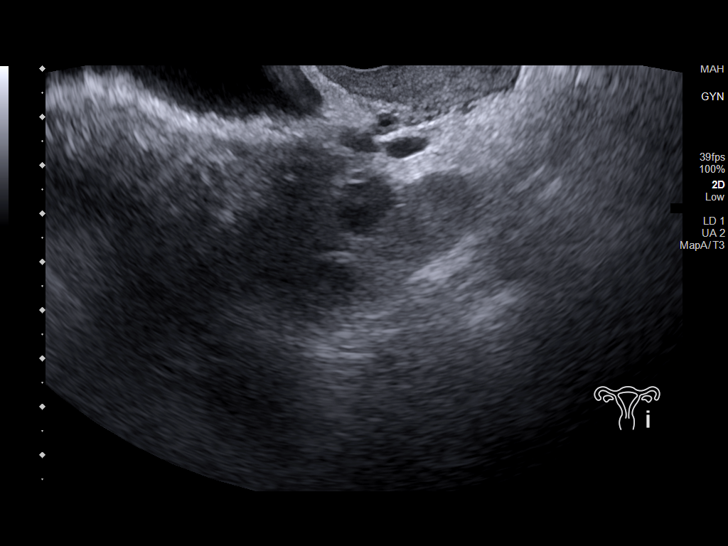
[im 115/115]
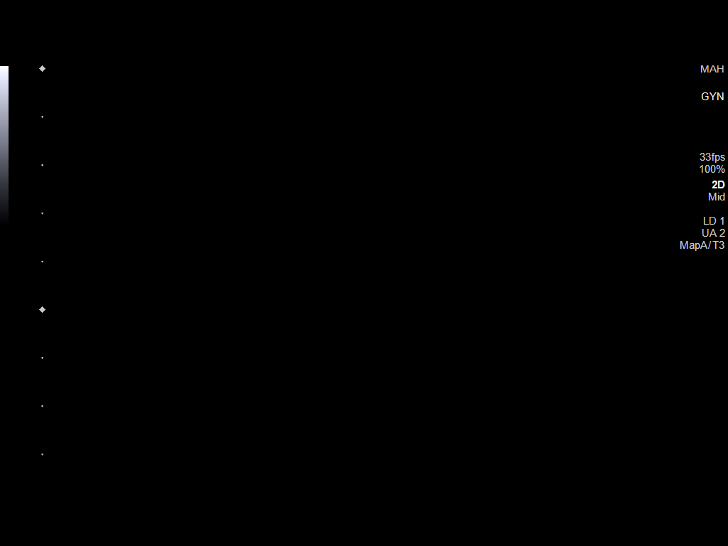

[13 of 25 positions shown; findings below may reference images not displayed]

FINDINGS: Uterus

Measurements: 11.5 x 6.0 x 8.1 cm = volume: 290 mL. Multiple uterine
leiomyomata, exophytic LEFT lateral 6.1 cm greatest diameter,
including exophytic RIGHT fundal 7.6 cm greatest diameter, and
central mid upper uterine leiomyomata 5.4 cm. Additional scattered
smaller leiomyomata.

Endometrium

Thickness: 9 mm. Hypoechoic nodule within endometrial complex 5 x 4
x 7 mm, question small complicated cyst or small amount of
endometrial fluid, slightly irregular. Endometrial complex is
generally heterogeneous.

Right ovary

Surgically absent

Left ovary

Measurements: 5.4 x 2.6 x 2.3 cm = volume: 16.9 mL. Normal
morphology without mass. Previously seen complicated cyst of the
LEFT ovary no longer visualized.

Other findings

Trace free pelvic fluid in RIGHT adnexa.  No adnexal masses.
IMPRESSION: Resolution of previously identified complicated cyst LEFT ovary.

Multiple uterine leiomyomata.

Heterogeneous endometrial complex with questionable 7 mm cyst versus
small focus of endometrial fluid.

## 2024-05-01 NOTE — Progress Notes (Unsigned)
        Established patient visit   History of Present Illness   Discussed the use of AI scribe software for clinical note transcription with the patient, who gave verbal consent to proceed.  History of Present Illness   Barbara Hartman is a 48 year old female with endometriosis who presents with abdominal bloating and fullness.  Abdominal bloating and fullness - Abdominal bloating and sensation of fullness in the lower abdomen for two weeks, persistent even without eating - Bloating exerts pressure on the bladder - Associated nausea without vomiting - Sensation of feeling 'off kilter' but no dizziness  Bowel habits and gastrointestinal symptoms - Constipation present, improved with daily fiber supplementation - Regular bowel movements since starting fiber - No back pain - No significant changes in eating or drinking habits - Healthy diet, avoids junk food and soda - Drinks approximately 128 ounces of water daily  Gynecologic and endometriosis-related symptoms - History of endometriosis with prior uterine ablation and unilateral oophorectomy - Cramping attributed to endometriosis - No significant pain or cramping outside of known endometriosis-related symptoms - Uncertain if current symptoms are related to endometriosis, as these symptoms are new   Physical Exam   Physical Exam Vitals reviewed.  Constitutional:      General: She is not in acute distress.    Appearance: Normal appearance. She is not ill-appearing.  HENT:     Head: Normocephalic and atraumatic.  Cardiovascular:     Rate and Rhythm: Normal rate and regular rhythm.     Pulses: Normal pulses.     Heart sounds: Normal heart sounds. No murmur heard.    No friction rub. No gallop.  Pulmonary:     Effort: Pulmonary effort is normal. No respiratory distress.     Breath sounds: Normal breath sounds. No wheezing.  Abdominal:     General: Bowel sounds are normal. There is distension (Mild bilateral lower quadrant  bloating).     Tenderness: There is abdominal tenderness in the right lower quadrant and left lower quadrant.     Hernia: No hernia is present.  Skin:    General: Skin is warm and dry.  Neurological:     Mental Status: She is alert and oriented to person, place, and time.  Psychiatric:        Mood and Affect: Mood normal.        Behavior: Behavior normal.        Thought Content: Thought content normal.        Judgment: Judgment normal.    Assessment & Plan     Abdominal bloating and fullness with constipation Constipation improved with fiber. Differential includes stool burden versus endometriosis. - Order abdominal x-ray to assess stool burden or abnormalities. - Recommend Miralax 17g twice daily for 2-3 days, then daily as needed. - Advise mixing Miralax in 4-8 ounces of non-carbonated fluid. - If symptoms do not improve, consider further imaging.    Follow up   No follow-ups on file. __________________________________ Zada FREDRIK Palin, DNP, APRN, FNP-BC Primary Care and Sports Medicine [facility]

## 2024-05-02 ENCOUNTER — Encounter: Payer: Self-pay | Admitting: Medical-Surgical

## 2024-05-02 ENCOUNTER — Ambulatory Visit (INDEPENDENT_AMBULATORY_CARE_PROVIDER_SITE_OTHER)

## 2024-05-02 ENCOUNTER — Ambulatory Visit: Admitting: Medical-Surgical

## 2024-05-02 VITALS — BP 130/86 | HR 75 | Temp 98.8°F | Resp 20 | Ht 63.0 in | Wt 176.4 lb

## 2024-05-02 DIAGNOSIS — R140 Abdominal distension (gaseous): Secondary | ICD-10-CM

## 2024-05-02 DIAGNOSIS — R109 Unspecified abdominal pain: Secondary | ICD-10-CM | POA: Diagnosis not present

## 2024-05-02 DIAGNOSIS — K5900 Constipation, unspecified: Secondary | ICD-10-CM | POA: Diagnosis not present

## 2024-05-02 DIAGNOSIS — R110 Nausea: Secondary | ICD-10-CM | POA: Diagnosis not present

## 2024-05-04 ENCOUNTER — Ambulatory Visit: Payer: Self-pay | Admitting: Medical-Surgical

## 2024-05-04 ENCOUNTER — Encounter: Payer: Self-pay | Admitting: Medical-Surgical

## 2024-05-04 DIAGNOSIS — R109 Unspecified abdominal pain: Secondary | ICD-10-CM

## 2024-05-04 NOTE — Telephone Encounter (Signed)
Changed to stat

## 2024-05-07 NOTE — Addendum Note (Signed)
 Addended byBETHA WILLO MINI on: 05/07/2024 03:14 PM   Modules accepted: Orders

## 2024-05-09 ENCOUNTER — Ambulatory Visit: Payer: Self-pay | Admitting: Medical-Surgical

## 2024-05-09 ENCOUNTER — Ambulatory Visit

## 2024-05-09 DIAGNOSIS — N809 Endometriosis, unspecified: Secondary | ICD-10-CM | POA: Diagnosis not present

## 2024-05-09 DIAGNOSIS — R140 Abdominal distension (gaseous): Secondary | ICD-10-CM

## 2024-05-09 DIAGNOSIS — R109 Unspecified abdominal pain: Secondary | ICD-10-CM

## 2024-05-09 DIAGNOSIS — D219 Benign neoplasm of connective and other soft tissue, unspecified: Secondary | ICD-10-CM

## 2024-05-09 DIAGNOSIS — N852 Hypertrophy of uterus: Secondary | ICD-10-CM

## 2024-05-24 NOTE — Progress Notes (Signed)
 GYNECOLOGY OFFICE VISIT NOTE  History:  Barbara Hartman is a 48 y.o. G0P0000 here today for consult regarding an enlarged uterus.   She had an US  on 07/2021 which showed three main fibroids: Central fibroid 5.4 cm Exophytic left 6.1 cm Exophytic right 7.6 cm I independently reviewed this US  images and agree.   She recently had a CT for abdominal pain/bloating and nausea.  I reviewed the images Central fibroid 5 cm? Hard to tell on this imaging Exophytic left 6.6 cm Exophytic right 10 cm  Also has a history of endometriosis. Had RSO for a dermoid in 2011.   Spouse had a vasectomy.   Discussed the use of AI scribe software for clinical note transcription with the patient, who gave verbal consent to proceed.  She experiences significant discomfort due to uterine fibroids, which are pressing on her bladder and causing difficulty with eating and sleeping. She describes a sensation of pressure on her bladder and spine, and notes that the fibroids are large enough to be felt near her belly button. She compares her symptoms to being pregnant highlighting the impact on her daily life.  She has a history of endometriosis and underwent an endometrial ablation and right ovary removal due to a dermoid cyst. Since the ablation, she has not had a menstrual period but experiences 'ghost periods' with associated pain. She has not been tested for menopause but suspects she may be perimenopausal.   Past Medical History:  Diagnosis Date   Clotting disorder 2011   had mass on ovary mass and overy taken out and ablation done   Hypertension     Past Surgical History:  Procedure Laterality Date   ABLATION     RIGHT OOPHORECTOMY      The following portions of the patient's history were reviewed and updated as appropriate: allergies, current medications, past family history, past medical history, past social history, past surgical history and problem list.   Health Maintenance:   Normal pap and  negative HRHPV:   Diagnosis  Date Value Ref Range Status  04/27/2021   Final   - Negative for intraepithelial lesion or malignancy (NILM)     Normal mammogram on 07/18/2023.   Review of Systems:  Pertinent items noted in HPI and remainder of comprehensive ROS otherwise negative.  Physical Exam:  BP (!) 136/92   Pulse 96   Ht 5' 3 (1.6 m)   Wt 168 lb (76.2 kg)   BMI 29.76 kg/m  CONSTITUTIONAL: Well-developed, well-nourished female in no acute distress.  HEENT:  Normocephalic, atraumatic. External right and left ear normal. No scleral icterus.  NECK: Normal range of motion, supple, no masses noted on observation SKIN: No rash noted. Not diaphoretic. No erythema. No pallor. MUSCULOSKELETAL: Normal range of motion. No edema noted. NEUROLOGIC: Alert and oriented to person, place, and time. Normal muscle tone coordination. No cranial nerve deficit noted. PSYCHIATRIC: Normal mood and affect. Normal behavior. Normal judgment and thought content.  PELVIC: Deferred  Labs and Imaging No results found for this or any previous visit (from the past week). CT ABDOMEN PELVIS WO CONTRAST Result Date: 05/09/2024 CLINICAL DATA:  Acute abdominal pain, bloating, and nausea for two weeks. Endometriosis. EXAM: CT ABDOMEN AND PELVIS WITHOUT CONTRAST TECHNIQUE: Multidetector CT imaging of the abdomen and pelvis was performed following the standard protocol without IV contrast. RADIATION DOSE REDUCTION: This exam was performed according to the departmental dose-optimization program which includes automated exposure control, adjustment of the mA and/or kV according to patient  size and/or use of iterative reconstruction technique. COMPARISON:  None Available. FINDINGS: Lower chest: No acute findings. Hepatobiliary: Several tiny sub-cm low attention lesions cannot be definitively characterized, but most likely represent tiny cysts. No suspicious lesions visualized on this unenhanced exam. Gallbladder is  unremarkable. No evidence of biliary ductal dilatation. Pancreas: No mass or inflammatory process visualized on this unenhanced exam. Spleen:  Within normal limits in size. Adrenals/Urinary tract: 2 mm calculus noted in upper pole of left kidney. No evidence of ureteral calculi or hydronephrosis. Extrinsic mass effect seen on posterior aspect of the urinary bladder from enlarged myomatous uterus. Stomach/Bowel: No evidence of obstruction, inflammatory process, or abnormal fluid collections. Normal appendix visualized. Vascular/Lymphatic: No pathologically enlarged lymph nodes identified. No evidence of abdominal aortic aneurysm. Reproductive: Markedly enlarged and multilobulated uterus with multiple fibroids is seen extending into the lower abdomen. Overall uterine measurements are approximately 18 x 10 x 15 cm. No No evidence of pelvic inflammatory changes or free fluid. Other:  None. Musculoskeletal:  No suspicious bone lesions identified. IMPRESSION: Markedly enlarged uterus with multiple fibroids. Tiny nonobstructing left renal calculus. No evidence of ureteral calculi or hydronephrosis. Electronically Signed   By: Norleen DELENA Kil M.D.   On: 05/09/2024 16:21   DG Abd 1 View Result Date: 05/04/2024 EXAM: 1 VIEW XRAY OF THE ABDOMEN 05/02/2024 11:22:00 AM COMPARISON: None available. CLINICAL HISTORY: 48 year-old female c/o lower abdomen pressure, dizziness, constipation and nausea x a few weeks; Hx of left ovary removed FINDINGS: BOWEL: Nonobstructive bowel gas pattern. SOFT TISSUES: No opaque urinary calculi. BONES: No acute osseous abnormality. IMPRESSION: 1. No bowel obstruction. Electronically signed by: Lynwood Seip MD 05/04/2024 03:52 PM EDT RP Workstation: [id number]    Assessment and Plan:  [name] was seen today for discuss ct.  Diagnoses and all orders for this visit:  Uterine leiomyoma, unspecified location - Uterine fibroids: The patient's fibroids are symptomatic and treatment options of  expectant management, medical therapy, and surgical therapy were discussed. - Expectant management - The patient's fibroids were discussed and expectant management was offered with strict precautions although not recommended.  - We discussed the GnRH-agonists and antagonists. Reviewed both short term and long term impact of these medications. Reviewed short term impact of Depo Lupron (agonist) on bleeding.  - We discussed surgical/procedural options available: UAE, myomectomy and hysterectomy. We discussed the risks and benefits for each of these specific procedures. For UAE, recommended preop MRI and referral to interventional radiology.    - Following counseling, the patient would like to proceed with hysterectomy. Recommend robotically. She will follow up with Dr. Jayne.   Amenorrhea Amenorrheic since ablation. Had RSO in past as well. Check FSH to see if postmenopausal vs not. Would help in knowing management plan for her remaining ovary. She currently has no symptoms of menopause.  -     FSH   No orders of the defined types were placed in this encounter.    Routine preventative health maintenance measures emphasized. Please refer to After Visit Summary for other counseling recommendations.   Return if symptoms worsen or fail to improve.  Vina Solian, MD, FACOG Obstetrician & Gynecologist, [facility] for [facility], [facility]

## 2024-05-28 ENCOUNTER — Encounter: Payer: Self-pay | Admitting: Obstetrics and Gynecology

## 2024-05-28 ENCOUNTER — Ambulatory Visit: Admitting: Obstetrics and Gynecology

## 2024-05-28 VITALS — BP 136/92 | HR 96 | Ht 63.0 in | Wt 168.0 lb

## 2024-05-28 DIAGNOSIS — N912 Amenorrhea, unspecified: Secondary | ICD-10-CM | POA: Diagnosis not present

## 2024-05-28 DIAGNOSIS — D259 Leiomyoma of uterus, unspecified: Secondary | ICD-10-CM | POA: Diagnosis not present

## 2024-05-29 ENCOUNTER — Ambulatory Visit: Payer: Self-pay | Admitting: Obstetrics and Gynecology

## 2024-05-29 LAB — FOLLICLE STIMULATING HORMONE: FSH: 12.3 m[IU]/mL

## 2024-06-18 ENCOUNTER — Ambulatory Visit: Admitting: Obstetrics & Gynecology

## 2024-07-02 ENCOUNTER — Encounter: Payer: Self-pay | Admitting: Obstetrics & Gynecology

## 2024-07-02 ENCOUNTER — Ambulatory Visit: Admitting: Obstetrics & Gynecology

## 2024-07-02 ENCOUNTER — Other Ambulatory Visit (HOSPITAL_COMMUNITY)
Admission: RE | Admit: 2024-07-02 | Discharge: 2024-07-02 | Disposition: A | Discharge status: Home / Self Care | Source: Ambulatory Visit | Attending: Obstetrics & Gynecology | Admitting: Obstetrics & Gynecology

## 2024-07-02 VITALS — BP 138/90 | HR 90 | Ht 63.0 in | Wt 172.0 lb

## 2024-07-02 DIAGNOSIS — Z124 Encounter for screening for malignant neoplasm of cervix: Secondary | ICD-10-CM

## 2024-07-02 DIAGNOSIS — D219 Benign neoplasm of connective and other soft tissue, unspecified: Secondary | ICD-10-CM

## 2024-07-02 DIAGNOSIS — R102 Pelvic and perineal pain unspecified side: Secondary | ICD-10-CM

## 2024-07-02 DIAGNOSIS — R3915 Urgency of urination: Secondary | ICD-10-CM

## 2024-07-02 NOTE — Addendum Note (Signed)
 Addended by: ILEAN RUTHERFORD HERO on: 07/02/2024 12:57 PM   Modules accepted: Orders

## 2024-07-02 NOTE — Progress Notes (Signed)
 Follow up appointment  Referral from Dr Cleatus for surgical consult  Chief Complaint  Patient presents with   Discuss hysterectomy    Blood pressure (!) 138/90, pulse 90, height 5' 3 (1.6 m), weight 172 lb (78 kg).  From Dr Elfredia note: She experiences significant discomfort due to uterine fibroids, which are pressing on her bladder and causing difficulty with eating and sleeping. She describes a sensation of pressure on her bladder and spine, and notes that the fibroids are large enough to be felt near her belly button. She compares her symptoms to being pregnant highlighting the impact on her daily life.   She has a history of endometriosis and underwent an endometrial ablation and right ovary removal due to a dermoid cyst. Since the ablation, she has not had a menstrual period but experiences 'ghost periods' with associated pain. She has not been tested for menopause but suspects she may be perimenopausal.  CLINICAL DATA:  Acute abdominal pain, bloating, and nausea for two weeks. Endometriosis.   EXAM: CT ABDOMEN AND PELVIS WITHOUT CONTRAST   TECHNIQUE: Multidetector CT imaging of the abdomen and pelvis was performed following the standard protocol without IV contrast.   RADIATION DOSE REDUCTION: This exam was performed according to the departmental dose-optimization program which includes automated exposure control, adjustment of the mA and/or kV according to patient size and/or use of iterative reconstruction technique.   COMPARISON:  None Available.   FINDINGS: Lower chest: No acute findings.   Hepatobiliary: Several tiny sub-cm low attention lesions cannot be definitively characterized, but most likely represent tiny cysts. No suspicious lesions visualized on this unenhanced exam. Gallbladder is unremarkable. No evidence of biliary ductal dilatation.   Pancreas: No mass or inflammatory process visualized on this unenhanced exam.   Spleen:  Within normal limits in  size.   Adrenals/Urinary tract: 2 mm calculus noted in upper pole of left kidney. No evidence of ureteral calculi or hydronephrosis. Extrinsic mass effect seen on posterior aspect of the urinary bladder from enlarged myomatous uterus.   Stomach/Bowel: No evidence of obstruction, inflammatory process, or abnormal fluid collections. Normal appendix visualized.   Vascular/Lymphatic: No pathologically enlarged lymph nodes identified. No evidence of abdominal aortic aneurysm.   Reproductive: Markedly enlarged and multilobulated uterus with multiple fibroids is seen extending into the lower abdomen. Overall uterine measurements are approximately 18 x 10 x 15 cm. No No evidence of pelvic inflammatory changes or free fluid.   Other:  None.   Musculoskeletal:  No suspicious bone lesions identified.   IMPRESSION: Markedly enlarged uterus with multiple fibroids. (1413 cc volume calculated)   Tiny nonobstructing left renal calculus. No evidence of ureteral calculi or hydronephrosis.     Electronically Signed   By: Norleen DELENA Kil M.D.   On: 05/09/2024 16:21  20 weeks size on exam with sidewall to sidewall extension  MEDS ordered this encounter: No orders of the defined types were placed in this encounter.   Orders for this encounter: Orders Placed This Encounter  Procedures   Ambulatory Referral For Surgery Scheduling  RA TLH + BSO 08/14/24  Impression + Management Plan   ICD-10-CM   1. Fibroids  D21.9 Ambulatory Referral For Surgery Scheduling    2. Pelvic pain  R10.20 Ambulatory Referral For Surgery Scheduling    3. Urinary urgency  R39.15 Ambulatory Referral For Surgery Scheduling      Follow Up: Return in about 8 weeks (around 08/27/2024) for Post Op.     All questions were answered.  Past  Medical History:  Diagnosis Date   Clotting disorder 2011   had mass on ovary mass and overy taken out and ablation done   Depression    Hypertension     Past Surgical  History:  Procedure Laterality Date   ABLATION     RIGHT OOPHORECTOMY      OB History     Gravida  0   Para  0   Term  0   Preterm  0   AB  0   Living  0      SAB  0   IAB  0   Ectopic  0   Multiple  0   Live Births  0           No Known Allergies  Social History   Socioeconomic History   Marital status: Married    Spouse name: Not on file   Number of children: Not on file   Years of education: Not on file   Highest education level: Associate degree: occupational, scientist, product/process development, or vocational program  Occupational History   Occupation: [employer] Tech  Tobacco Use   Smoking status: Former    Current packs/day: 0.00    Average packs/day: 1.5 packs/day for 14.0 years (21.0 ttl pk-yrs)    Types: Cigarettes    Quit date: 01/29/2013    Years since quitting: 11.4   Smokeless tobacco: Never  Vaping Use   Vaping status: Never Used  Substance and Sexual Activity   Alcohol use: Yes    Comment: Occasionally   Drug use: Never   Sexual activity: Yes    Partners: Male    Birth control/protection: Other-see comments    Comment: Ablation  Other Topics Concern   Not on file  Social History Narrative   Not on file   Social Drivers of Health   Financial Resource Strain: Low Risk  (05/01/2024)   Overall Financial Resource Strain (CARDIA)    Difficulty of Paying Living Expenses: Not hard at all  Food Insecurity: No Food Insecurity (05/01/2024)   Hunger Vital Sign    Worried About Running Out of Food in the Last Year: Never true    Ran Out of Food in the Last Year: Never true  Transportation Needs: No Transportation Needs (05/01/2024)   PRAPARE - Administrator, Civil Service (Medical): No    Lack of Transportation (Non-Medical): No  Physical Activity: Sufficiently Active (05/01/2024)   Exercise Vital Sign    Days of Exercise per Week: 7 days    Minutes of Exercise per Session: 60 min  Stress: No Stress Concern Present (05/01/2024)   [employer] of  Occupational Health - Occupational Stress Questionnaire    Feeling of Stress: Not at all  Social Connections: Socially Isolated (05/01/2024)   Social Connection and Isolation Panel    Frequency of Communication with Friends and Family: Twice a week    Frequency of Social Gatherings with Friends and Family: Never    Attends Religious Services: Never    Database Administrator or Organizations: No    Attends Engineer, Structural: Not on file    Marital Status: Married    Family History  Problem Relation Age of Onset   Hypertension Mother    Hypertension Father    Stroke Father    Bladder Cancer Father    Cancer Father    Diabetes Father    Hypertension Brother    Hypertension Brother    Diabetes Maternal Grandmother  Stroke Paternal Grandfather    Hypertension Brother    Hypertension Brother

## 2024-07-03 ENCOUNTER — Ambulatory Visit: Admitting: Obstetrics & Gynecology

## 2024-07-03 ENCOUNTER — Encounter: Payer: Self-pay | Admitting: Obstetrics & Gynecology

## 2024-07-03 LAB — CYTOLOGY - PAP
Adequacy: ABSENT
Comment: NEGATIVE
Diagnosis: NEGATIVE
High risk HPV: NEGATIVE

## 2024-07-30 ENCOUNTER — Other Ambulatory Visit (HOSPITAL_BASED_OUTPATIENT_CLINIC_OR_DEPARTMENT_OTHER): Payer: Self-pay

## 2024-08-08 NOTE — Patient Instructions (Addendum)
 "  Your procedure is scheduled on 08/14/24.  Report to [facility] Main Entrance at 6:00 A.M.   Call this number if you have problems the morning of surgery:  [phone]  If you experience any cold or flu symptoms such as cough, fever, chills, shortness of breath, etc. between now and your scheduled surgery, please notify us  at the above number.   Remember:  Do not eat after midnight.   You may drink clear liquids until 3:30 am .  Clear liquids allowed are:  Water, Juice (No red color; non-citric and without pulp; diabetics please choose diet or no sugar options), Carbonated beverages (diabetics please choose diet or no sugar options), Clear Tea (No creamer, milk, or cream, including half & half and powdered creamer), Black Coffee Only (No creamer, milk or cream, including half & half and powdered creamer), and Clear Sports drink (No red color; diabetics please choose diet or no sugar options)    Take these medicines the morning of surgery with A SIP OF WATER  WELLBUTRIN    Use inhaler if needed.     Do not wear jewelry, make-up or nail polish, including gel polish,  artificial nails, or any other type of covering on natural nails.  Do not wear lotions, powders, or perfumes, or deodorant.  Do not shave 48 hours prior to surgery.    Do not bring valuables to the hospital.  [facility] is not responsible for any belongings or valuables.  Contacts, dentures or bridgework may not be worn into surgery.  Leave your suitcase in the car.  After surgery it may be brought to your room.  For patients admitted to the hospital, discharge time will be determined by your treatment team.  Patients discharged the day of surgery will not be allowed to drive home and must have someone be with them for 24 hours.   Special instructions: DO NOT SMOKE TOBACCO OR VAPE 24 HOURS PRIOR TO YOUR PROCEDURE.   Please brush your teeth morning of procedure.   Please read over the following: Anesthesia  Post-op Instructions and Care and Recovery After Surgery    General Anesthesia, Adult, Care After The following information offers guidance on how to care for yourself after your procedure. Your health care provider may also give you more specific instructions. If you have problems or questions, contact your health care provider. What can I expect after the procedure? After the procedure, it is common for people to: Have pain or discomfort at the IV site. Have nausea or vomiting. Have a sore throat or hoarseness. Have trouble concentrating. Feel cold or chills. Feel weak, sleepy, or tired (fatigue). Have soreness and body aches. These can affect parts of the body that were not involved in surgery. Follow these instructions at home: For the time period you were told by your health care provider:  Rest. Do not participate in activities where you could fall or become injured. Do not drive or use machinery. Do not drink alcohol. Do not take sleeping pills or medicines that cause drowsiness. Do not make important decisions or sign legal documents. Do not take care of children on your own. General instructions Drink enough fluid to keep your urine pale yellow. If you have sleep apnea, surgery and certain medicines can increase your risk for breathing problems. Follow instructions from your health care provider about wearing your sleep device: Anytime you are sleeping, including during daytime naps. While taking prescription pain medicines, sleeping medicines, or medicines that make you drowsy.  Return to your normal activities as told by your health care provider. Ask your health care provider what activities are safe for you. Take over-the-counter and prescription medicines only as told by your health care provider. Do not use any products that contain nicotine or tobacco. These products include cigarettes, chewing tobacco, and vaping devices, such as e-cigarettes. These can delay incision  healing after surgery. If you need help quitting, ask your health care provider. Contact a health care provider if: You have nausea or vomiting that does not get better with medicine. You vomit every time you eat or drink. You have pain that does not get better with medicine. You cannot urinate or have bloody urine. You develop a skin rash. You have a fever. Get help right away if: You have trouble breathing. You have chest pain. You vomit blood. These symptoms may be an emergency. Get help right away. Call 911. Do not wait to see if the symptoms will go away. Do not drive yourself to the hospital. Summary After the procedure, it is common to have a sore throat, hoarseness, nausea, vomiting, or to feel weak, sleepy, or fatigue. For the time period you were told by your health care provider, do not drive or use machinery. Get help right away if you have difficulty breathing, have chest pain, or vomit blood. These symptoms may be an emergency. This information is not intended to replace advice given to you by your health care provider. Make sure you discuss any questions you have with your health care provider. Document Revised: 10/09/2021 Document Reviewed: 10/09/2021 Elsevier Patient Education  2024 Elsevier Inc.   How to Use Chlorhexidine at Home in the Shower Chlorhexidine gluconate (CHG) is a germ-killing (antiseptic) wash that's used to clean the skin. It can get rid of the germs that normally live on the skin and can keep them away for about 24 hours. If you're having surgery, you may be told to shower with CHG at home the night before surgery. This can help lower your risk for infection. To use CHG wash in the shower, follow the steps below. Supplies needed: CHG body wash. Clean washcloth. Clean towel. How to use CHG in the shower Follow these steps unless you're told to use CHG in a different way: Start the shower. Use your normal soap and shampoo to wash your face and  hair. Turn off the shower or move out of the shower stream. Pour CHG onto a clean washcloth. Do not use any type of brush or rough sponge. Start at your neck, washing your body down to your toes. Make sure you: Wash the part of your body where the surgery will be done for at least 1 minute. Do not scrub. Do not use CHG on your head or face unless your health care provider tells you to. If it gets into your ears or eyes, rinse them well with water. Do not wash your genitals with CHG. Wash your back and under your arms. Make sure to wash skin folds. Let the CHG sit on your skin for 1-2 minutes or as long as told. Rinse your entire body in the shower, including all body creases and folds. Turn off the shower. Dry off with a clean towel. Do not put anything on your skin afterward, such as powder, lotion, or perfume. Put on clean clothes or pajamas. If it's the night before surgery, sleep in clean sheets. General tips Use CHG only as told, and follow the instructions on the label. Use  the full amount of CHG as told. This is often one bottle. Do not smoke and stay away from flames after using CHG. Your skin may feel sticky after using CHG. This is normal. The sticky feeling will go away as the CHG dries. Do not use CHG: If you have a chlorhexidine allergy or have reacted to chlorhexidine in the past. On open wounds or areas of skin that have broken skin, cuts, or scrapes. On babies younger than 40 months of age. Contact a health care provider if: You have questions about using CHG. Your skin gets irritated or itchy. You have a rash after using CHG. You swallow any CHG. Call your local poison control center [phone] in the [place] Your eyes itch badly, or they become very red or swollen. Your hearing changes. You have trouble seeing. If you can't reach your provider, go to an urgent care or emergency room. Do not drive yourself. Get help right away if: You have swelling or tingling in  your mouth or throat. You make high-pitched whistling sounds when you breathe, most often when you breathe out (wheeze). You have trouble breathing. These symptoms may be an emergency. Call 911 right away. Do not wait to see if the symptoms will go away. Do not drive yourself to the hospital. This information is not intended to replace advice given to you by your health care provider. Make sure you discuss any questions you have with your health care provider. Document Revised: 01/25/2023 Document Reviewed: 01/21/2022 Elsevier Patient Education  2024 Elsevier Inc.    Laparoscopically Assisted Vaginal Hysterectomy  A laparoscopic assisted vaginal hysterectomy (LAVH) is a surgery to remove the uterus. The surgery is minimally invasive. This means that very small incisions are made, instead of one large incision. This surgery can be done with or without a robot. During this surgery, your fallopian tubes and ovaries may also be taken out. This surgery may be done if you have: Growths in the uterus, such as fibroids. This is the most common reason. Endometriosis. This is when the lining of the uterus is growing outside of the uterus. Pelvic organ prolapse. This is when the uterus has moved down and bulges into the vagina. Cancer of the uterus or cervix. Abnormal or heavy bleeding during your period. Long-term (chronic) pain in the pelvis. After this surgery, you'll no longer be able to have a baby, and you'll no longer have a period. Tell a health care provider about: Any allergies you have. All medicines you're taking. These include vitamins, herbs, eye drops, creams, and over-the-counter medicines. Any problems you or family members have had with anesthesia. Any bleeding problems you have. Any surgeries you've had. Any medical conditions you have. Whether you're pregnant or may be pregnant. What are the risks? Your health care provider will talk with you about risks. These may  include: Bleeding or blood clots in the legs or lungs. Infection. Damage to nearby structures or organs, including nerve, bladder, or bowel injury. Reaction to the medicines used. Early symptoms of menopause. You may have hot flashes, mood swings, dryness of the vagina, and low estrogen if both ovaries are removed. What happens before the surgery? When to stop eating and drinking Follow instructions from your provider about what you may eat and drink. These may include: 8 hours before your surgery Stop eating most foods. Do not eat meat, fried foods, or fatty foods. Eat only light foods, such as toast or crackers. All liquids are okay except energy drinks and  alcohol. 6 hours before your surgery Stop eating. Drink only clear liquids, such as water, clear fruit juice, black coffee, plain tea, and sports drinks. Do not drink energy drinks or alcohol. 2 hours before your surgery Stop drinking all liquids. You may be allowed to take medicines with small sips of water. If you don't follow your provider's instructions, your surgery may be delayed or canceled. Medicines Ask your provider about: Changing or stopping your regular medicines. These include any diabetes medicines or blood thinners you take. Taking medicines, such as aspirin and ibuprofen. These medicines can thin your blood. Do not take them unless you are told to. Vitamins, herbs, and supplements. You may be asked to take a medicine to empty your colon (bowel prep). Surgery safety For your safety, your provider may: Remove hair at the surgery site. Ask you to wash with a soap that kills germs. Give you antibiotics. General instructions If you were asked to do a bowel prep before the surgery, do it as told by your provider. Do not use any products that contain nicotine or tobacco for at least 4 weeks before the surgery. These products include cigarettes, chewing tobacco, and vaping devices, such as e-cigarettes. If you need  help quitting, ask your provider. What happens during the surgery? You will be given: A sedative. This helps you relax. Anesthesia. This keeps you from feeling pain. It will make you fall asleep. Your surgeon will make several small cuts, or incisions, in your belly. Tools for surgery will be inserted into the small incisions. The surgeon will take out your uterus through the vagina. Your small incisions will be closed with stitches, skin glue, or tape strips. This surgery may vary among providers and hospitals. What happens after the surgery? Your blood pressure, heart rate, breathing rate, and blood oxygen level will be monitored until you leave the hospital. You will be given pain medicine as needed. You will have a soft tube, or catheter, in place for a few hours. It will likely be removed the day after surgery. You will wear a pad because you will have bleeding and discharge from the vagina. Where to find more information [employer]: acog.org This information is not intended to replace advice given to you by your health care provider. Make sure you discuss any questions you have with your health care provider. Document Revised: 10/22/2022 Document Reviewed: 10/22/2022 Elsevier Patient Education  2024 [employer]. "

## 2024-08-10 ENCOUNTER — Encounter (HOSPITAL_COMMUNITY)
Admission: RE | Admit: 2024-08-10 | Discharge: 2024-08-10 | Disposition: A | Discharge status: Home / Self Care | Payer: Self-pay | Source: Ambulatory Visit | Attending: Obstetrics & Gynecology | Admitting: Obstetrics & Gynecology

## 2024-08-10 ENCOUNTER — Other Ambulatory Visit: Payer: Self-pay

## 2024-08-10 ENCOUNTER — Encounter (HOSPITAL_COMMUNITY): Payer: Self-pay

## 2024-08-10 ENCOUNTER — Other Ambulatory Visit: Payer: Self-pay | Admitting: Obstetrics & Gynecology

## 2024-08-10 VITALS — BP 145/98 | HR 96 | Resp 16 | Ht 63.0 in | Wt 172.0 lb

## 2024-08-10 DIAGNOSIS — Z01818 Encounter for other preprocedural examination: Secondary | ICD-10-CM

## 2024-08-10 DIAGNOSIS — I10 Essential (primary) hypertension: Secondary | ICD-10-CM | POA: Diagnosis not present

## 2024-08-10 HISTORY — DX: Other specified postprocedural states: R11.2

## 2024-08-10 LAB — URINALYSIS, ROUTINE W REFLEX MICROSCOPIC
Bilirubin Urine: NEGATIVE
Glucose, UA: NEGATIVE mg/dL
Ketones, ur: NEGATIVE mg/dL
Leukocytes,Ua: NEGATIVE
Nitrite: NEGATIVE
Protein, ur: NEGATIVE mg/dL
Specific Gravity, Urine: 1.004 — ABNORMAL LOW (ref 1.005–1.030)
pH: 6 (ref 5.0–8.0)

## 2024-08-10 LAB — COMPREHENSIVE METABOLIC PANEL WITH GFR
ALT: 8 U/L (ref 0–44)
AST: 16 U/L (ref 15–41)
Albumin: 4.1 g/dL (ref 3.5–5.0)
Alkaline Phosphatase: 59 U/L (ref 38–126)
Anion gap: 12 (ref 5–15)
BUN: 19 mg/dL (ref 6–20)
CO2: 20 mmol/L — ABNORMAL LOW (ref 22–32)
Calcium: 8.7 mg/dL — ABNORMAL LOW (ref 8.9–10.3)
Chloride: 106 mmol/L (ref 98–111)
Creatinine, Ser: 0.72 mg/dL (ref 0.44–1.00)
GFR, Estimated: >60 mL/min
Glucose, Bld: 78 mg/dL (ref 70–99)
Potassium: 4 mmol/L (ref 3.5–5.1)
Sodium: 138 mmol/L (ref 135–145)
Total Bilirubin: 0.4 mg/dL (ref 0.0–1.2)
Total Protein: 7 g/dL (ref 6.5–8.1)

## 2024-08-10 LAB — CBC
HCT: 40.5 % (ref 36.0–46.0)
Hemoglobin: 13.8 g/dL (ref 12.0–15.0)
MCH: 31.1 pg (ref 26.0–34.0)
MCHC: 34.1 g/dL (ref 30.0–36.0)
MCV: 91.2 fL (ref 80.0–100.0)
Platelets: 272 K/uL (ref 150–400)
RBC: 4.44 MIL/uL (ref 3.87–5.11)
RDW: 13.2 % (ref 11.5–15.5)
WBC: 6.3 K/uL (ref 4.0–10.5)
nRBC: 0 % (ref 0.0–0.2)

## 2024-08-10 LAB — RAPID HIV SCREEN (HIV 1/2 AB+AG)
HIV 1/2 Antibodies: NONREACTIVE
HIV-1 P24 Antigen - HIV24: NONREACTIVE

## 2024-08-10 LAB — TYPE AND SCREEN
ABO/RH(D): O POS
Antibody Screen: NEGATIVE

## 2024-08-10 LAB — PREGNANCY, URINE: Preg Test, Ur: NEGATIVE

## 2024-08-14 ENCOUNTER — Other Ambulatory Visit: Payer: Self-pay

## 2024-08-14 ENCOUNTER — Ambulatory Visit (HOSPITAL_COMMUNITY)
Admission: RE | Admit: 2024-08-14 | Discharge: 2024-08-14 | Disposition: A | Discharge status: Home / Self Care | Payer: Self-pay | Attending: Obstetrics & Gynecology | Admitting: Obstetrics & Gynecology

## 2024-08-14 ENCOUNTER — Ambulatory Visit (HOSPITAL_COMMUNITY): Payer: Self-pay | Admitting: Anesthesiology

## 2024-08-14 ENCOUNTER — Encounter (HOSPITAL_COMMUNITY): Payer: Self-pay | Admitting: Anesthesiology

## 2024-08-14 ENCOUNTER — Encounter (HOSPITAL_COMMUNITY): Payer: Self-pay | Admitting: Obstetrics & Gynecology

## 2024-08-14 ENCOUNTER — Encounter (HOSPITAL_COMMUNITY)
Admission: RE | Disposition: A | Discharge status: Home / Self Care | Payer: Self-pay | Source: Home / Self Care | Attending: Obstetrics & Gynecology

## 2024-08-14 ENCOUNTER — Other Ambulatory Visit (HOSPITAL_BASED_OUTPATIENT_CLINIC_OR_DEPARTMENT_OTHER): Payer: Self-pay

## 2024-08-14 DIAGNOSIS — R3915 Urgency of urination: Secondary | ICD-10-CM | POA: Diagnosis present

## 2024-08-14 DIAGNOSIS — N8003 Adenomyosis of the uterus: Secondary | ICD-10-CM

## 2024-08-14 DIAGNOSIS — I10 Essential (primary) hypertension: Secondary | ICD-10-CM | POA: Insufficient documentation

## 2024-08-14 DIAGNOSIS — N888 Other specified noninflammatory disorders of cervix uteri: Secondary | ICD-10-CM

## 2024-08-14 DIAGNOSIS — D259 Leiomyoma of uterus, unspecified: Secondary | ICD-10-CM | POA: Insufficient documentation

## 2024-08-14 DIAGNOSIS — R102 Pelvic and perineal pain unspecified side: Secondary | ICD-10-CM | POA: Diagnosis not present

## 2024-08-14 DIAGNOSIS — D251 Intramural leiomyoma of uterus: Secondary | ICD-10-CM | POA: Diagnosis not present

## 2024-08-14 DIAGNOSIS — N8312 Corpus luteum cyst of left ovary: Secondary | ICD-10-CM | POA: Diagnosis not present

## 2024-08-14 DIAGNOSIS — Z87891 Personal history of nicotine dependence: Secondary | ICD-10-CM | POA: Insufficient documentation

## 2024-08-14 DIAGNOSIS — N83202 Unspecified ovarian cyst, left side: Secondary | ICD-10-CM | POA: Diagnosis not present

## 2024-08-14 DIAGNOSIS — N80202 Endometriosis of left fallopian tube, unspecified depth: Secondary | ICD-10-CM

## 2024-08-14 DIAGNOSIS — M545 Low back pain, unspecified: Secondary | ICD-10-CM

## 2024-08-14 DIAGNOSIS — Z01818 Encounter for other preprocedural examination: Secondary | ICD-10-CM

## 2024-08-14 HISTORY — PX: HYSTERECTOMY,TOTAL,BILAT SALPINGO-OOPHORECTOMY, ROBOT, LAP: SHX7359

## 2024-08-14 LAB — ABO/RH: ABO/RH(D): O POS

## 2024-08-14 MED ORDER — HYDROMORPHONE HCL 1 MG/ML IJ SOLN
INTRAMUSCULAR | Status: DC | PRN
  Administered 2024-08-14: .5 mg via INTRAVENOUS

## 2024-08-14 MED ORDER — FENTANYL CITRATE (PF) 100 MCG/2ML IJ SOLN
INTRAMUSCULAR | Status: AC
  Filled 2024-08-14: qty 2

## 2024-08-14 MED ORDER — CEFAZOLIN SODIUM-DEXTROSE 2-4 GM/100ML-% IV SOLN
2.0000 g | INTRAVENOUS | Status: AC
  Administered 2024-08-14: 2 g via INTRAVENOUS
  Filled 2024-08-14: qty 100

## 2024-08-14 MED ORDER — CHLORHEXIDINE GLUCONATE 0.12 % MT SOLN
15.0000 mL | Freq: Once | OROMUCOSAL | Status: AC
  Administered 2024-08-14: 15 mL via OROMUCOSAL

## 2024-08-14 MED ORDER — PROPOFOL 10 MG/ML IV BOLUS
INTRAVENOUS | Status: DC | PRN
  Administered 2024-08-14: 200 mg via INTRAVENOUS
  Administered 2024-08-14: 10 mg via INTRAVENOUS

## 2024-08-14 MED ORDER — DEXAMETHASONE SOD PHOSPHATE PF 10 MG/ML IJ SOLN
INTRAMUSCULAR | Status: DC | PRN
  Administered 2024-08-14: 8 mg via INTRAVENOUS

## 2024-08-14 MED ORDER — MIDAZOLAM HCL 5 MG/5ML IJ SOLN
INTRAMUSCULAR | Status: DC | PRN
  Administered 2024-08-14: 2 mg via INTRAVENOUS

## 2024-08-14 MED ORDER — OXYCODONE HCL 5 MG/5ML PO SOLN: 5.0000 mg | Freq: Once | ORAL | Status: AC | PRN

## 2024-08-14 MED ORDER — MIDAZOLAM HCL 2 MG/2ML IJ SOLN
INTRAMUSCULAR | Status: AC
  Filled 2024-08-14: qty 2

## 2024-08-14 MED ORDER — ONDANSETRON HCL 4 MG/2ML IJ SOLN
INTRAMUSCULAR | Status: AC
  Filled 2024-08-14: qty 2

## 2024-08-14 MED ORDER — FENTANYL CITRATE (PF) 100 MCG/2ML IJ SOLN
INTRAMUSCULAR | Status: DC | PRN
  Administered 2024-08-14 (×3): 100 ug via INTRAVENOUS

## 2024-08-14 MED ORDER — BUPIVACAINE HCL (PF) 0.25 % IJ SOLN
INTRAMUSCULAR | Status: AC
  Filled 2024-08-14: qty 60

## 2024-08-14 MED ORDER — ROCURONIUM BROMIDE 10 MG/ML (PF) SYRINGE
PREFILLED_SYRINGE | INTRAVENOUS | Status: DC | PRN
  Administered 2024-08-14: 10 mg via INTRAVENOUS
  Administered 2024-08-14: 20 mg via INTRAVENOUS
  Administered 2024-08-14: 100 mg via INTRAVENOUS

## 2024-08-14 MED ORDER — BUPIVACAINE HCL 0.25 % IJ SOLN
INTRAMUSCULAR | Status: DC | PRN
  Administered 2024-08-14: 40 mL

## 2024-08-14 MED ORDER — ORAL CARE MOUTH RINSE: 15.0000 mL | Freq: Once | OROMUCOSAL | Status: AC

## 2024-08-14 MED ORDER — ESTRADIOL 0.1 MG/24HR TD PTTW
1.0000 | MEDICATED_PATCH | TRANSDERMAL | 12 refills | Status: AC
  Filled 2024-08-14: qty 8, 28d supply, fill #0

## 2024-08-14 MED ORDER — ROCURONIUM BROMIDE 10 MG/ML (PF) SYRINGE
PREFILLED_SYRINGE | INTRAVENOUS | Status: AC
  Filled 2024-08-14: qty 10

## 2024-08-14 MED ORDER — HYDROMORPHONE HCL 1 MG/ML IJ SOLN
0.2500 mg | INTRAMUSCULAR | Status: DC | PRN
  Administered 2024-08-14 (×4): 0.5 mg via INTRAVENOUS
  Filled 2024-08-14 (×4): qty 0.5

## 2024-08-14 MED ORDER — DEXAMETHASONE SOD PHOSPHATE PF 10 MG/ML IJ SOLN
INTRAMUSCULAR | Status: AC
  Filled 2024-08-14: qty 1

## 2024-08-14 MED ORDER — DEXMEDETOMIDINE HCL IN NACL 80 MCG/20ML IV SOLN
INTRAVENOUS | Status: DC | PRN
  Administered 2024-08-14: 20 ug via INTRAVENOUS

## 2024-08-14 MED ORDER — PROPOFOL 10 MG/ML IV BOLUS
INTRAVENOUS | Status: AC
  Filled 2024-08-14: qty 20

## 2024-08-14 MED ORDER — OXYCODONE-ACETAMINOPHEN 7.5-325 MG PO TABS
1.0000 | ORAL_TABLET | Freq: Four times a day (QID) | ORAL | 0 refills | Status: AC | PRN
  Filled 2024-08-14: qty 28, 7d supply, fill #0

## 2024-08-14 MED ORDER — LACTATED RINGERS IV SOLN: INTRAVENOUS | Status: DC

## 2024-08-14 MED ORDER — HYDROMORPHONE HCL 1 MG/ML IJ SOLN
INTRAMUSCULAR | Status: AC
  Filled 2024-08-14: qty 0.5

## 2024-08-14 MED ORDER — STERILE WATER FOR IRRIGATION IR SOLN
Status: DC | PRN
  Administered 2024-08-14: 1000 mL

## 2024-08-14 MED ORDER — KETOROLAC TROMETHAMINE 30 MG/ML IJ SOLN
30.0000 mg | INTRAMUSCULAR | Status: AC
  Administered 2024-08-14: 30 mg via INTRAVENOUS
  Filled 2024-08-14: qty 1

## 2024-08-14 MED ORDER — LIDOCAINE 2% (20 MG/ML) 5 ML SYRINGE
INTRAMUSCULAR | Status: AC
  Filled 2024-08-14: qty 5

## 2024-08-14 MED ORDER — DEXMEDETOMIDINE HCL IN NACL 80 MCG/20ML IV SOLN
INTRAVENOUS | Status: AC
  Filled 2024-08-14: qty 20

## 2024-08-14 MED ORDER — CIPROFLOXACIN HCL 500 MG PO TABS
500.0000 mg | ORAL_TABLET | Freq: Two times a day (BID) | ORAL | 0 refills | Status: AC
  Filled 2024-08-14: qty 14, 7d supply, fill #0

## 2024-08-14 MED ORDER — ONDANSETRON HCL 4 MG/2ML IJ SOLN
INTRAMUSCULAR | Status: DC | PRN
  Administered 2024-08-14: 4 mg via INTRAVENOUS

## 2024-08-14 MED ORDER — KETOROLAC TROMETHAMINE 10 MG PO TABS
10.0000 mg | ORAL_TABLET | Freq: Three times a day (TID) | ORAL | 0 refills | Status: AC | PRN
  Filled 2024-08-14: qty 15, 5d supply, fill #0

## 2024-08-14 MED ORDER — POVIDONE-IODINE 10 % EX SWAB
2.0000 | Freq: Once | CUTANEOUS | Status: AC
  Administered 2024-08-14: 2 via TOPICAL

## 2024-08-14 MED ORDER — SCOPOLAMINE 1 MG/3DAYS TD PT72
1.0000 | MEDICATED_PATCH | Freq: Once | TRANSDERMAL | Status: DC
  Administered 2024-08-14: 1 mg via TRANSDERMAL
  Filled 2024-08-14: qty 1

## 2024-08-14 MED ORDER — TRANEXAMIC ACID-NACL 1000-0.7 MG/100ML-% IV SOLN
1000.0000 mg | Freq: Once | INTRAVENOUS | Status: AC
  Administered 2024-08-14: 1000 mg via INTRAVENOUS
  Filled 2024-08-14: qty 100

## 2024-08-14 MED ORDER — OXYCODONE HCL 5 MG PO TABS
5.0000 mg | ORAL_TABLET | Freq: Once | ORAL | Status: AC | PRN
  Administered 2024-08-14: 5 mg via ORAL
  Filled 2024-08-14: qty 1

## 2024-08-14 MED ORDER — ONDANSETRON 8 MG PO TBDP
8.0000 mg | ORAL_TABLET | Freq: Three times a day (TID) | ORAL | 0 refills | Status: AC | PRN
  Filled 2024-08-14: qty 8, 3d supply, fill #0

## 2024-08-14 MED ORDER — SUGAMMADEX SODIUM 200 MG/2ML IV SOLN
INTRAVENOUS | Status: DC | PRN
  Administered 2024-08-14: 300 mg via INTRAVENOUS

## 2024-08-14 NOTE — H&P (Signed)
 " Preoperative History and Physical  Barbara Hartman is a 49 y.o. G0P0000 with No LMP recorded. Patient has had an ablation. admitted for a RA TLH BSO for enlarged fibroid uterus causing pain.  Volume calculated 1421 cc, 20 weeks size on exam.    PMH:    Past Medical History:  Diagnosis Date   Clotting disorder 2011   had mass on ovary mass and overy taken out and ablation done   Depression    Hypertension    PONV (postoperative nausea and vomiting)     PSH:     Past Surgical History:  Procedure Laterality Date   ABLATION     RIGHT OOPHORECTOMY      POb/GynH:      OB History     Gravida  0   Para  0   Term  0   Preterm  0   AB  0   Living  0      SAB  0   IAB  0   Ectopic  0   Multiple  0   Live Births  0           SH:  Social History[1]  FH:    Family History  Problem Relation Age of Onset   Hypertension Mother    Hypertension Father    Stroke Father    Bladder Cancer Father    Cancer Father    Diabetes Father    Hypertension Brother    Hypertension Brother    Diabetes Maternal Grandmother    Stroke Paternal Grandfather    Hypertension Brother    Hypertension Brother      Allergies: Allergies[2]  Medications:      Current Medications[3]  Review of Systems:   Review of Systems  Constitutional: Negative for fever, chills, weight loss, malaise/fatigue and diaphoresis.  HENT: Negative for hearing loss, ear pain, nosebleeds, congestion, sore throat, neck pain, tinnitus and ear discharge.   Eyes: Negative for blurred vision, double vision, photophobia, pain, discharge and redness.  Respiratory: Negative for cough, hemoptysis, sputum production, shortness of breath, wheezing and stridor.   Cardiovascular: Negative for chest pain, palpitations, orthopnea, claudication, leg swelling and PND.  Gastrointestinal: Positive for abdominal pain. Negative for heartburn, nausea, vomiting, diarrhea, constipation, blood in stool and melena.   Genitourinary: Negative for dysuria, urgency, frequency, hematuria and flank pain.  Musculoskeletal: Negative for myalgias, back pain, joint pain and falls.  Skin: Negative for itching and rash.  Neurological: Negative for dizziness, tingling, tremors, sensory change, speech change, focal weakness, seizures, loss of consciousness, weakness and headaches.  Endo/Heme/Allergies: Negative for environmental allergies and polydipsia. Does not bruise/bleed easily.  Psychiatric/Behavioral: Negative for depression, suicidal ideas, hallucinations, memory loss and substance abuse. The patient is not nervous/anxious and does not have insomnia.      PHYSICAL EXAM:  Blood pressure 136/82, pulse 75, temperature (!) 97.5 F (36.4 C), temperature source Oral, resp. rate 15, height 5' 3 (1.6 m), weight 75.8 kg, SpO2 99%.    Vitals reviewed. Constitutional: She is oriented to person, place, and time. She appears well-developed and well-nourished.  HENT:  Head: Normocephalic and atraumatic.  Right Ear: External ear normal.  Left Ear: External ear normal.  Nose: Nose normal.  Mouth/Throat: Oropharynx is clear and moist.  Eyes: Conjunctivae and EOM are normal. Pupils are equal, round, and reactive to light. Right eye exhibits no discharge. Left eye exhibits no discharge. No scleral icterus.  Neck: Normal range of motion. Neck supple. No tracheal  deviation present. No thyromegaly present.  Cardiovascular: Normal rate, regular rhythm, normal heart sounds and intact distal pulses.  Exam reveals no gallop and no friction rub.   No murmur heard. Respiratory: Effort normal and breath sounds normal. No respiratory distress. She has no wheezes. She has no rales. She exhibits no tenderness.  GI: Soft. Bowel sounds are normal. She exhibits no distension and no mass. There is tenderness. There is no rebound and no guarding.  Genitourinary:       Vulva is normal without lesions Vagina is pink moist without  discharge Cervix normal in appearance and pap is normal Uterus is 20 weeks size, at umbilicus Adnexa is negative with normal sized ovaries by sonogram  Musculoskeletal: Normal range of motion. She exhibits no edema and no tenderness.  Neurological: She is alert and oriented to person, place, and time. She has normal reflexes. She displays normal reflexes. No cranial nerve deficit. She exhibits normal muscle tone. Coordination normal.  Skin: Skin is warm and dry. No rash noted. No erythema. No pallor.  Psychiatric: She has a normal mood and affect. Her behavior is normal. Judgment and thought content normal.    Labs: Results for orders placed or performed during the hospital encounter of 08/10/24 (from the past 2 weeks)  CBC   Collection Time: 08/10/24  3:05 PM  Result Value Ref Range   WBC 6.3 4.0 - 10.5 K/uL   RBC 4.44 3.87 - 5.11 MIL/uL   Hemoglobin 13.8 12.0 - 15.0 g/dL   HCT 59.4 63.9 - 53.9 %   MCV 91.2 80.0 - 100.0 fL   MCH 31.1 26.0 - 34.0 pg   MCHC 34.1 30.0 - 36.0 g/dL   RDW 86.7 88.4 - 84.4 %   Platelets 272 150 - 400 K/uL   nRBC 0.0 0.0 - 0.2 %  Comprehensive metabolic panel   Collection Time: 08/10/24  3:05 PM  Result Value Ref Range   Sodium 138 135 - 145 mmol/L   Potassium 4.0 3.5 - 5.1 mmol/L   Chloride 106 98 - 111 mmol/L   CO2 20 (L) 22 - 32 mmol/L   Glucose, Bld 78 70 - 99 mg/dL   BUN 19 6 - 20 mg/dL   Creatinine, Ser 9.27 0.44 - 1.00 mg/dL   Calcium 8.7 (L) 8.9 - 10.3 mg/dL   Total Protein 7.0 6.5 - 8.1 g/dL   Albumin 4.1 3.5 - 5.0 g/dL   AST 16 15 - 41 U/L   ALT 8 0 - 44 U/L   Alkaline Phosphatase 59 38 - 126 U/L   Total Bilirubin 0.4 0.0 - 1.2 mg/dL   GFR, Estimated >39 >39 mL/min   Anion gap 12 5 - 15  Rapid HIV screen (HIV 1/2 Ab+Ag)   Collection Time: 08/10/24  3:05 PM  Result Value Ref Range   HIV-1 P24 Antigen - HIV24 NON REACTIVE NON REACTIVE   HIV 1/2 Antibodies NON REACTIVE NON REACTIVE   Interpretation (HIV Ag Ab)      A non reactive  test result means that HIV 1 or HIV 2 antibodies and HIV 1 p24 antigen were not detected in the specimen.  Urinalysis, Routine w reflex microscopic -Urine, Clean Catch   Collection Time: 08/10/24  3:05 PM  Result Value Ref Range   Color, Urine STRAW (A) YELLOW   APPearance CLEAR CLEAR   Specific Gravity, Urine 1.004 (L) 1.005 - 1.030   pH 6.0 5.0 - 8.0   Glucose, UA NEGATIVE NEGATIVE mg/dL  Hgb urine dipstick SMALL (A) NEGATIVE   Bilirubin Urine NEGATIVE NEGATIVE   Ketones, ur NEGATIVE NEGATIVE mg/dL   Protein, ur NEGATIVE NEGATIVE mg/dL   Nitrite NEGATIVE NEGATIVE   Leukocytes,Ua NEGATIVE NEGATIVE   RBC / HPF 0-5 0 - 5 RBC/hpf   WBC, UA 0-5 0 - 5 WBC/hpf   Bacteria, UA RARE (A) NONE SEEN   Squamous Epithelial / HPF 0-5 0 - 5 /HPF  Pregnancy, urine   Collection Time: 08/10/24  3:05 PM  Result Value Ref Range   Preg Test, Ur NEGATIVE NEGATIVE  Type and screen   Collection Time: 08/10/24  3:05 PM  Result Value Ref Range   ABO/RH(D) O POS    Antibody Screen NEG    Sample Expiration 08/13/2024,2359    Extend sample reason      NO TRANSFUSIONS OR PREGNANCY IN THE PAST 3 MONTHS Performed at [facility], [address] [place], [place] [address]     EKG: Orders placed or performed during the hospital encounter of 08/10/24   EKG 12-LEAD   EKG 12-LEAD    Imaging Studies: No results found.    Assessment: 20 week fibroid uterus, 1421 cc by CT scan Pelvic pain as a result of fibroids Back pain as a result of fibroids  Plan: RA TLH BSO  Vonn VEAR Inch 08/14/2024 7:24 AM          [1]  Social History Tobacco Use   Smoking status: Former    Current packs/day: 0.00    Average packs/day: 1.5 packs/day for 14.0 years (21.0 ttl pk-yrs)    Types: Cigarettes    Quit date: 01/29/2013    Years since quitting: 11.5   Smokeless tobacco: Never  Vaping Use   Vaping status: Never Used  Substance Use Topics   Alcohol use: Yes    Comment: Occasionally   Drug use: Never   [2] Not on File [3]  Current Facility-Administered Medications:    ceFAZolin  (ANCEF ) IVPB 2g/100 mL premix, 2 g, Intravenous, On Call to OR, Aliz Meritt, Vonn VEAR, MD   lactated ringers  infusion, , Intravenous, Continuous, Herschell Hollering, MD   scopolamine  (TRANSDERM-SCOP) 1 MG/3DAYS 1 mg, 1 patch, Transdermal, Once, Herschell Hollering, MD, 1 mg at 08/14/24 0719   tranexamic acid  (CYKLOKAPRON ) IVPB 1,000 mg, 1,000 mg, Intravenous, Once, Inch Vonn VEAR, MD  "

## 2024-08-14 NOTE — Anesthesia Postprocedure Evaluation (Signed)
"   Anesthesia Post Note  Patient: Advertising Copywriter  Procedure(s) Performed: HYSTERECTOMY, TOTAL, BILATERAL SAPLINGO-OOPHORECTOMY, ROBOT ASSISTED, LAPAROSCOPIC, D5 (Left: Abdomen)  Patient location during evaluation: PACU Anesthesia Type: General Level of consciousness: awake and alert Pain management: pain level controlled Vital Signs Assessment: post-procedure vital signs reviewed and stable Respiratory status: spontaneous breathing, nonlabored ventilation, respiratory function stable and patient connected to nasal cannula oxygen Cardiovascular status: blood pressure returned to baseline and stable Postop Assessment: no apparent nausea or vomiting Anesthetic complications: no   No notable events documented.   Last Vitals:  Vitals:   08/14/24 0644 08/14/24 1133  BP: 136/82 115/70  Pulse: 75 71  Resp: 15 (!) 8  Temp: (!) 36.4 C 36.4 C  SpO2: 99% 100%    Last Pain:  Vitals:   08/14/24 1133  TempSrc:   PainSc: 8                  Andrea Limes      "

## 2024-08-14 NOTE — Anesthesia Preprocedure Evaluation (Addendum)
"                                    Anesthesia Evaluation  Patient identified by MRN, date of birth, ID band Patient awake    Reviewed: Allergy & Precautions, H&P , NPO status , Patient's Chart, lab work & pertinent test results  History of Anesthesia Complications (+) PONV and history of anesthetic complications  Airway Mallampati: II  TM Distance: >3 FB Neck ROM: Full    Dental no notable dental hx.    Pulmonary former smoker   Pulmonary exam normal breath sounds clear to auscultation       Cardiovascular hypertension, Normal cardiovascular exam+ dysrhythmias  Rhythm:Regular Rate:Normal  One episode of possible SVT 10 years ago Negative cardiac workup and no episodes since   Neuro/Psych  PSYCHIATRIC DISORDERS  Depression    negative neurological ROS     GI/Hepatic negative GI ROS, Neg liver ROS,,,  Endo/Other  negative endocrine ROS    Renal/GU negative Renal ROS  negative genitourinary   Musculoskeletal negative musculoskeletal ROS (+)    Abdominal   Peds negative pediatric ROS (+)  Hematology negative hematology ROS (+)   Anesthesia Other Findings   Reproductive/Obstetrics negative OB ROS                              Anesthesia Physical Anesthesia Plan  ASA: 2  Anesthesia Plan: General   Post-op Pain Management:    Induction: Intravenous  PONV Risk Score and Plan: Scopolamine  patch - Pre-op  Airway Management Planned: Oral ETT  Additional Equipment:   Intra-op Plan:   Post-operative Plan: Extubation in OR  Informed Consent: I have reviewed the patients History and Physical, chart, labs and discussed the procedure including the risks, benefits and alternatives for the proposed anesthesia with the patient or authorized representative who has indicated his/her understanding and acceptance.     Dental advisory given  Plan Discussed with: CRNA  Anesthesia Plan Comments:          Anesthesia  Quick Evaluation  "

## 2024-08-14 NOTE — Op Note (Signed)
 " Preoperative diagnosis: 20 weeks size fibroid uterus, 1421 cc measured volume on CT scan Pelivc pain Back pain   Postoperative diagnosis: SAA   Procedure: dV5 Robotic hysterectomy with removal of left tube and ovary (right tube and ovary previously removed)  Laparoscopic guided transversus abdominus plane block for post op pain management  Surgeon: Vonn VEAR Inch, MD   Anesthesia: General endotracheal   Findings: Enlarged fibroid uterus, 2 pedunculated fibromas? Adhesions from old infection?TOAs it appears   Description of operation: Patient was taken to the operating room and placed in the low lithotomy position She was prepped and draped in the usual sterile fashion robotic assisted laparoscopic procedure A Foley catheter was placed The vagina was once again prepped additionally   A RUMI II 10 cm with 4 cervical cup was placed for uterine manipulation and colpotomy delineation   An incision was made above the umbilicus The umbilical fascia was grasped The fascia was incisied with scissors underdirect visualization A small gel port was placed   An 8 mm port was placed into the peritoneal cavity via the gel port    Tfibroids and adhesions were noted    4 additional 8 mm ports were placed at approximately the same level as the supraumbilical port 3 of the ports were robotic and 1 is an assist port   One was left lateral and 1 was placed right lateral, both lateral to the rectus anterior muscle These were placed under direct visualization without difficulty into the peritoneal cavity Nonbladed trocars were used in all instances    The patient was placed in 24 degrees of Trendelenburg   The [employer] robot was then docked to the 4 robotic ports   Instruments used during the robotic hysterectomy: Vessel sealer extender using bipolar energy ProGrasp forceps with no energy Monopolar hook Megacut needle driver 2-0 PDS symmetrical STRATAFIX on a CT 2 needle   I  then left the patient and went to the surgical console   The RUMI II  was used throughout the case to apply traction and anterior/posterior displacement of the uterus to facilitate the robotic procedure The ProGrasp was used to place passive traction  I perforemd 2 myomectomies of mostly pedunculated fibromas to facilitate the rest of the surgery The left ureter was identified and found to be well away from the adnexal vessels The vessel sealer extender using bipolar energy was used and the infundibulo pelvic ligament was coagulated and then transected I used traction medially and anteriorly and used the vessel sealer to take the broad ligament and round ligament down to the level of the cervical isthmus just lateral to the left uterine vessels There were adhesions of the adnexa and uterus to the pelvic sidewall, likely previous TOAs   I then turned my attention to the right adnexa which was surgically absent The pro grasp was used and medial and upward traction was placed The vessel sealer extender using bipolar energy was used to coagulate and ligate the right infundibulopelvic ligament vessels Again the right ureter was well inferior to the vessels I continued use medial and anterior retraction using the ProGrasp and the vessel sealer with the bipolar energy was used to take the broad ligament and round ligament down to the level of the cervical isthmus and lateral to the uterine vessels   I then placed the monopolar hook in the place of the ProGrasp The vessel sealer extender was used to grasp the peritoneum of the bladder provide traction, of course  no energy was used for this I used the monopolar hook with energy to open of the peritoneal leaf anteriorly and dissect the bladder peritoneum off the lower uterine segment and cervix beyond the level of the vaginal colpotomy cup I then used the monopolar hook to take the peritoneum down where I stopped using the vessel sealer and met in the  bladder dissection bilaterally   The vessel sealer extender with monopolar energy was used to coagulate the uterine vessels at the level of the cervical isthmus bilaterally and then just above and just below to manage backbleeding The uterine vessels were transected There was good hemostasis I then stayed medial to this uterine vessel pedicle with the vessel sealer extender and took down the paracervical tissue to allow colpotomy incision using the monopolar hook, preserving the cardinal ligament Again there was good hemostasis bilaterally   I then used the monopolar hook and made a posterior colpotomy incision above the level of insertion of the uterosacral ligaments I used a frowny face then smiley face technique and opened the vagina to approximately 8:00 and 4:00 posteriorly I then used the vessel sealer extender with bipolar energy, coagulating and then transecting the vagina   The monopolar hook was used to make the anterior colpotomy incision as the bladder had been dissected well past the colpotomy ring Cephalad tension was placed on the Vcare handle throughout this portion of the procedure to prevent thermal injury to the bladder and safe distance from the ureters bilaterally Colpotomy incision was made from 10:00 to 2:00 The vessel sealer with bipolar energy was then used to coagulate and transect the vagina, again inside of the cardinal ligament   The uterus was removed through the abdomen see below   The left tube and ovary were also removed through abdomen A wet lap tape was placed in the vagina to maintain pneumoperitoneum   All pedicles were found to be hemostatic there was very little blood loss I then removed the vessel sealer and monopolar hook The pro grasp was replaced and the needle driver was also placed along with the suture   The vagina was closed using the 2-0 PDS STRATAFIX symmetrical suture on the CT 2 needle I pay close attention to the vaginal corners  bilaterally and incorporated those in the closure 1.5 cm of vagina was operating to the vaginal closure I also fixed the uterosacral ligaments to the vaginal cuff repair shortening the uterosacral ligaments thus providing improved vaginal vault suspension The cardinal ligaments were intact again improving postoperative vaginal support Pubocervical rectovaginal and posterior peritoneum were all included in the vaginal closure There was good result hemostasis   At the end of the procedure all the pedicles were identified and found to be hemostatic Both ureters were identified and undergoing normal peristalsis, they were well out of the way of surgical field throughout   I then got up from the console and went back to the side of the patient after gowning and gloving sterilely  I placed a  transversus abdominus plane block was placed using laparoscopic guidance at T10 and T7 bilaterally, 10 cc of 0.25% bupivacaine  at each site, 40 cc total of 100 mg of bupivacaine .  Doyle's bubble technique was used. This was placed for post operative pain management.  ExCITE technique was used to remove the specimen through the extended supraumbilical incision.  Total time of 45 minutes was required for complete tissue extraction.    The 5 robotic ports were removed The insufflation ports  were used to actively desufflate the peritoneal cavity to a pressure of 0 Ports were then removed   The subcutaneous tissue of all 5 incisions was closed using 3-0 Vicryl All 5 skin incisions were closed using 3-0 vicryl in a subcuticular manner Dermabond was used all 5 incision sites  Each incision was dressed   The patient tolerated the procedure well She received 2 g of Ancef  and 30 mg of Toradol  preoperatively   EBL: 250 cc   Vonn VEAR Inch, MD 08/14/2024 11:36 AM       "

## 2024-08-14 NOTE — Transfer of Care (Signed)
 Immediate Anesthesia Transfer of Care Note  Patient: Barbara Hartman  Procedure(s) Performed: HYSTERECTOMY, TOTAL, BILATERAL SAPLINGO-OOPHORECTOMY, ROBOT ASSISTED, LAPAROSCOPIC, D5 (Left: Abdomen)  Patient Location: PACU  Anesthesia Type:General  Level of Consciousness: awake, alert , oriented, and patient cooperative  Airway & Oxygen Therapy: Patient Spontanous Breathing  Post-op Assessment: Report given to RN, Post -op Vital signs reviewed and stable, and Patient moving all extremities X 4  Post vital signs: Reviewed and stable  Last Vitals:  Vitals Value Taken Time  BP 115/70 08/14/24 11:33  Temp 97.6 1136  Pulse 71 08/14/24 11:36  Resp 10 08/14/24 11:36  SpO2 99 % 08/14/24 11:36  Vitals shown include unfiled device data.  Last Pain:  Vitals:   08/14/24 0644  TempSrc: Oral  PainSc: 0-No pain         Complications: No notable events documented.

## 2024-08-14 NOTE — Discharge Instructions (Signed)
 SABRA

## 2024-08-14 NOTE — Anesthesia Procedure Notes (Signed)
 Procedure Name: Intubation Date/Time: 08/14/2024 7:51 AM  Performed by: Cordella Elvie HERO, CRNAPre-anesthesia Checklist: Patient identified, Emergency Drugs available, Suction available, Patient being monitored and Timeout performed Patient Re-evaluated:Patient Re-evaluated prior to induction Oxygen Delivery Method: Circle system utilized Preoxygenation: Pre-oxygenation with 100% oxygen Induction Type: IV induction Ventilation: Mask ventilation without difficulty Laryngoscope Size: Mac and 3 Grade View: Grade I Tube type: Oral Tube size: 7.0 mm Number of attempts: 1 Airway Equipment and Method: Stylet Placement Confirmation: ETT inserted through vocal cords under direct vision, positive ETCO2, CO2 detector and breath sounds checked- equal and bilateral Secured at: 22 cm Tube secured with: Tape Dental Injury: Teeth and Oropharynx as per pre-operative assessment

## 2024-08-15 ENCOUNTER — Encounter (HOSPITAL_COMMUNITY): Payer: Self-pay | Admitting: Obstetrics & Gynecology

## 2024-08-16 LAB — SURGICAL PATHOLOGY

## 2024-08-26 ENCOUNTER — Encounter: Payer: Self-pay | Admitting: *Deleted

## 2024-08-27 ENCOUNTER — Encounter: Payer: Self-pay | Admitting: Obstetrics & Gynecology

## 2024-08-28 ENCOUNTER — Ambulatory Visit: Admitting: Obstetrics & Gynecology

## 2024-08-28 VITALS — BP 151/92 | HR 86

## 2024-08-28 DIAGNOSIS — Z9889 Other specified postprocedural states: Secondary | ICD-10-CM

## 2024-08-28 DIAGNOSIS — Z48816 Encounter for surgical aftercare following surgery on the genitourinary system: Secondary | ICD-10-CM

## 2024-08-28 NOTE — Progress Notes (Signed)
" °  HPI: Patient returns for routine postoperative follow-up having undergone   No diagnosis found.   The patient's immediate postoperative recovery has been unremarkable. Since hospital discharge the patient reports no problems Vasomotor symptoms are improved.   Current Outpatient Medications: ascorbic acid (VITAMIN C) 500 MG tablet, Take 500 mg by mouth daily., Disp: , Rfl:  buPROPion  (WELLBUTRIN  XL) 300 MG 24 hr tablet, Take 1 tablet (300 mg total) by mouth every morning., Disp: 90 tablet, Rfl: 3 cholecalciferol (VITAMIN D3) 25 MCG (1000 UNIT) tablet, Take 1,000 Units by mouth daily., Disp: , Rfl:  estradiol  (VIVELLE -DOT) 0.1 MG/24HR patch, Place 1 patch (0.1 mg total) onto the skin 2 (two) times a week., Disp: 8 patch, Rfl: 12 ketorolac  (TORADOL ) 10 MG tablet, Take 1 tablet (10 mg total) by mouth every 8 (eight) hours as needed., Disp: 15 tablet, Rfl: 0 oxyCODONE -acetaminophen  (PERCOCET) 7.5-325 MG tablet, Take 1 tablet by mouth every 6 (six) hours as needed., Disp: 28 tablet, Rfl: 0 ciprofloxacin  (CIPRO ) 500 MG tablet, Take 1 tablet (500 mg total) by mouth 2 (two) times daily. (Patient not taking: Reported on 08/28/2024), Disp: 14 tablet, Rfl: 0 ondansetron  (ZOFRAN -ODT) 8 MG disintegrating tablet, Take 1 tablet (8 mg total) by mouth every 8 (eight) hours as needed for nausea or vomiting. (Patient not taking: Reported on 08/28/2024), Disp: 8 tablet, Rfl: 0  No current facility-administered medications for this visit.    Blood pressure (!) 151/92, pulse 86.  Physical Exam: 5 incisions all look good bruising Abdomen is benign  Diagnostic Tests:   Pathology: benign  Impression + Management plan:   ICD-10-CM   1. Post-operative state: S/P RA TLH LSO 08/14/24 No post op issues  Z98.890      No sex 12 weeks     Medications Prescribed this encounter: No orders of the defined types were placed in this encounter.     Follow up: Seeing Dr Cleatus 09/27/24   Vonn VEAR Inch,  MD Attending Physician for the [facility] and [facility] 08/28/2024 3:03 PM     "

## 2024-09-27 ENCOUNTER — Encounter: Admitting: Obstetrics and Gynecology

## 2024-11-22 ENCOUNTER — Encounter: Admitting: Medical-Surgical
